# Patient Record
Sex: Female | Born: 1980
Health system: Southern US, Community
[De-identification: ages and names within clinical notes are randomized; demographics above are authoritative.]

## PROBLEM LIST (undated history)

## (undated) DIAGNOSIS — F419 Anxiety disorder, unspecified: Secondary | ICD-10-CM

## (undated) DIAGNOSIS — E785 Hyperlipidemia, unspecified: Secondary | ICD-10-CM

## (undated) DIAGNOSIS — T7840XA Allergy, unspecified, initial encounter: Secondary | ICD-10-CM

## (undated) DIAGNOSIS — IMO0001 Reserved for inherently not codable concepts without codable children: Secondary | ICD-10-CM

## (undated) HISTORY — DX: Hyperlipidemia, unspecified: E78.5

## (undated) HISTORY — DX: Anxiety disorder, unspecified: F41.9

## (undated) HISTORY — DX: Allergy, unspecified, initial encounter: T78.40XA

---

## 2011-11-24 ENCOUNTER — Encounter: Payer: Self-pay | Admitting: *Deleted

## 2011-11-24 ENCOUNTER — Emergency Department
Admission: EM | Admit: 2011-11-24 | Discharge: 2011-11-24 | Disposition: A | Discharge status: Home / Self Care | Payer: BC Managed Care – PPO | Source: Home / Self Care | Attending: Emergency Medicine | Admitting: Emergency Medicine

## 2011-11-24 DIAGNOSIS — B001 Herpesviral vesicular dermatitis: Secondary | ICD-10-CM

## 2011-11-24 DIAGNOSIS — B009 Herpesviral infection, unspecified: Secondary | ICD-10-CM

## 2011-11-24 DIAGNOSIS — J069 Acute upper respiratory infection, unspecified: Secondary | ICD-10-CM

## 2011-11-24 HISTORY — DX: Reserved for inherently not codable concepts without codable children: IMO0001

## 2011-11-24 MED ORDER — ACYCLOVIR 200 MG/5ML PO SUSP
400.0000 mg | Freq: Three times a day (TID) | ORAL | Status: DC
Start: 2011-11-24 — End: 2011-11-24

## 2011-11-24 MED ORDER — AZITHROMYCIN 250 MG PO TABS: ORAL_TABLET | ORAL | Status: AC

## 2011-11-24 MED ORDER — ACYCLOVIR 200 MG/5ML PO SUSP: ORAL | Status: DC

## 2011-11-24 NOTE — ED Notes (Signed)
Patient c/o productive cough, fever and congestion x 2 days.

## 2011-11-25 NOTE — ED Provider Notes (Signed)
History     CSN: [id number]  Arrival date & time 11/24/11  4098   First MD Initiated Contact with Patient 11/24/11 [phone]      Chief Complaint  Patient presents with  . Cough    HPI  Jody Gay is a 30 y.o. female who complains of onset of cold symptoms for 1 day.  Has not been using any over-the-counter treatment . She is [redacted] weeks pregnant, now in the second trimester, without any reported problems.  Her daughter has URI symptoms. She complains of severe flareup of cold sores lower lip which she has had chronically and recurrently. In the past, was prescribed acyclovir liquid for these which was very effective without side effects. She specifically request that I refilled the acyclovir liquid today.  No sore throat  + cough, with scant yellow sputum. No pleuritic pain No wheezing No nasal congestion + mild post-nasal drainage No sinus pain/pressure No chest congestion No itchy/red eyes No earache No hemoptysis No shortness of breath No chills/sweats + low grade fever No nausea No vomiting No abdominal pain No diarrhea No skin rashes + fatigue No myalgias No headache   Past Medical History  Diagnosis Date  . Gestational age less than 24 weeks     14weeks    Past Surgical History  Procedure Date  . Cesarean section     Family History  Problem Relation Age of Onset  . Cancer Mother     breast  . Hypertension Father   . Bipolar disorder Father   . Bipolar disorder Sister     History  Substance Use Topics  . Smoking status: Never Smoker   . Smokeless tobacco: Not on file  . Alcohol Use: No    OB History    Grav Para Term Preterm Abortions TAB SAB Ect Mult Living   1               Review of Systems  Allergies  Codeine; Gluten; and Penicillins  Home Medications   Current Outpatient Rx  Name Route Sig Dispense Refill  . PRENATAL 27-0.8 MG PO TABS Oral Take 1 tablet by mouth daily.      . ACYCLOVIR 200 MG/5ML PO SUSP  Take 10 mL's by mouth 3  times a day x5 days for acute cold sore flareup 150 mL 0  . AZITHROMYCIN 250 MG PO TABS  Take 2 tablets on day one, then 1 tablet daily on days 2 through 5 1 each 0    BP 116/75  Pulse 109  Temp(Src) 98.7 F (37.1 C) (Oral)  Resp 16  Ht 5\' 5"  (1.651 m)  Wt 137 lb 12.8 oz (62.506 kg)  BMI 22.93 kg/m2  SpO2 100%  Physical Exam  Nursing note and vitals reviewed. Constitutional: She is oriented to person, place, and time. She appears well-developed and well-nourished. No distress.  HENT:  Head: Normocephalic and atraumatic.  Right Ear: Tympanic membrane normal.  Left Ear: Tympanic membrane normal.  Nose: Nose normal.  Mouth/Throat: Oropharynx is clear and moist. No oropharyngeal exudate.       In the mid-lower lip, there is a prominent inflamed, swollen, exquisitely painful ulceration/cold sore. No drainage. No other oral or lip lesions.  Eyes: Right eye exhibits no discharge. Left eye exhibits no discharge. No scleral icterus.  Neck: Neck supple.  Cardiovascular: Normal rate, regular rhythm and normal heart sounds.   Pulmonary/Chest: No respiratory distress. She has no wheezes. She has rhonchi (few anterior rhonchi which clear after coughing.).  She has no rales.  Lymphadenopathy:    She has no cervical adenopathy.  Neurological: She is alert and oriented to person, place, and time.  Skin: Skin is warm and dry.    ED Course  Procedures (including critical care time)  Labs Reviewed - No data to display No results found.   1. URI (upper respiratory infection)   2. Recurrent cold sores       MDM  Treatment options discussed at length with patient. She likely has a viral URI/early bronchitis.  Given that she's in her second trimester, I explained that I would be very conservative with prescribing any medication. At her request, I prescribe Zithromax Z Pak, but specifically advised her not to get this filled unless her URI symptoms worsened , with higher fever and cough  productive of colored sputum. I researched on [employer] software that Zithromax is category B in pregnancy, with no adverse effects having been shown for patients in second trimester. I agreed to Rx the acyclovir your suspension, which she specifically requested as opposed to the acyclovir capsules. Again I counseled her on risks, benefits, alternatives.  I researched on [employer] software that acyclovir is category B in pregnancy, with no adverse effects having been shown for patients in second trimester. I specifically advised her to check with her OB/GYN before taking any of the above medications. Options discussed. Risks, benefits, alternatives discussed. Patient voiced understanding and agreement.         Lonell Face, MD 11/25/11 [phone]

## 2014-10-02 ENCOUNTER — Encounter: Payer: Self-pay | Admitting: *Deleted

## 2015-10-02 ENCOUNTER — Encounter: Payer: Self-pay | Admitting: *Deleted

## 2015-10-02 ENCOUNTER — Emergency Department (INDEPENDENT_AMBULATORY_CARE_PROVIDER_SITE_OTHER): Payer: BLUE CROSS/BLUE SHIELD

## 2015-10-02 ENCOUNTER — Emergency Department
Admission: EM | Admit: 2015-10-02 | Discharge: 2015-10-02 | Disposition: A | Discharge status: Home / Self Care | Payer: BLUE CROSS/BLUE SHIELD | Source: Home / Self Care | Attending: Family Medicine | Admitting: Family Medicine

## 2015-10-02 DIAGNOSIS — K5900 Constipation, unspecified: Secondary | ICD-10-CM

## 2015-10-02 DIAGNOSIS — R109 Unspecified abdominal pain: Secondary | ICD-10-CM

## 2015-10-02 MED ORDER — POLYETHYLENE GLYCOL 3350 17 G PO PACK: PACK | ORAL | Status: DC

## 2015-10-02 NOTE — ED Notes (Signed)
Pt c/o difficulty having a BM, some nausea, and abd cramping x 4 days. Reports taking Miralax and stool softener on Sunday and Monday with some relief. Denies fever. Reports some bright red blood with BM on Friday after straining.

## 2015-10-02 NOTE — Discharge Instructions (Signed)
You may use Miralax every 4 hours until you have a bowel movement.  You may then continue to use 1-2 times daily until you have regular bowel movements.  You may also try Apple, Prune, or Pear juice to help with bowel movements.   Be sure to stay well hydrated.  If you develop severe abdominal pain, unable to keep down fluids, unable to pass gas, or other new concerning symptoms develop, please call 911 or go to closest emergency department for further evaluation and treatment. See below for further instructions.

## 2015-10-02 NOTE — ED Provider Notes (Signed)
CSN: [id number]     Arrival date & time 10/02/15  1309 History   First MD Initiated Contact with Patient 10/02/15 1331     Chief Complaint  Patient presents with  . Constipation   (Consider location/radiation/quality/duration/timing/severity/associated sxs/prior Treatment) HPI Pt is a 34[facility] with c/o constipation with associated cramping worse in lower abdomen for 4 days.  Pt reports using Xylitol for 2 weeks but stopped on Thursday when she started to feel bloated.  Pt states she has felt constipated since.  She did try Miralax and a stool softener on Sunday and Monday with minimal relief.  Pt does report small amount of bright red blood after a BM on Friday but states she was straining at that time. No bleeding since then.  She is still able to pass gas. Denies fever, nausea or vomiting.  She has had a C-section several years ago but no other abdominal surgeries. Denies hx of SBO.    Past Medical History  Diagnosis Date  . Gestational age less than 24 weeks     14 weeks   Past Surgical History  Procedure Laterality Date  . Cesarean section     Family History  Problem Relation Age of Onset  . Cancer Mother     breast  . Hypertension Father   . Bipolar disorder Father   . Bipolar disorder Sister    Social History  Substance Use Topics  . Smoking status: Never Smoker   . Smokeless tobacco: None  . Alcohol Use: Yes   OB History    Gravida Para Term Preterm AB TAB SAB Ectopic Multiple Living   1              Review of Systems  Constitutional: Negative for fever and chills.  Gastrointestinal: Positive for abdominal pain, diarrhea and constipation. Negative for nausea and vomiting.  Genitourinary: Negative for dysuria and flank pain.  Musculoskeletal: Negative for myalgias and back pain.    Allergies  Codeine; Gluten; and Penicillins  Home Medications   Prior to Admission medications   Medication Sig Start Date End Date Taking? Authorizing Provider   polyethylene glycol (MIRALAX / GLYCOLAX) packet 17g (~1tblspn) Q4h until bowel movement. Daily or bid until regular BMs 10/02/15   Junius FinnerErin O'Malley, PA-C   Meds Ordered and Administered this Visit  Medications - No data to display  BP 150/88 mmHg  Pulse 76  Temp(Src) 98.3 F (36.8 C) (Oral)  Resp 16  Ht 5\' 5"  (1.651 m)  Wt 143 lb (64.864 kg)  BMI 23.80 kg/m2  SpO2 97%  LMP 09/11/2015  Breastfeeding? Unknown No data found.   Physical Exam  Constitutional: She appears well-developed and well-nourished. No distress.  HENT:  Head: Normocephalic and atraumatic.  Mouth/Throat: Oropharynx is clear and moist.  Eyes: Conjunctivae are normal. No scleral icterus.  Neck: Normal range of motion.  Cardiovascular: Normal rate, regular rhythm and normal heart sounds.   Pulmonary/Chest: Effort normal and breath sounds normal. No respiratory distress. She has no wheezes. She has no rales. She exhibits no tenderness.  Abdominal: Soft. Bowel sounds are normal. She exhibits no distension and no mass. There is no tenderness. There is no rebound and no guarding.  Soft, non-distended, non-tender. No rebound, guarding or masses.   Musculoskeletal: Normal range of motion.  Neurological: She is alert.  Skin: Skin is warm and dry. She is not diaphoretic.  Nursing note and vitals reviewed.   ED Course  Procedures (including critical care time)  Labs Review Labs Reviewed - No data to display  Imaging Review Dg Abd 2 Views  10/02/2015  CLINICAL DATA:  Abdominal pain, constipation EXAM: ABDOMEN - 2 VIEW COMPARISON:  None. FINDINGS: There is normal small bowel gas pattern. Moderate stool noted in right colon. Bony structures are unremarkable. Lumbar spine is unremarkable. Degenerative changes pubic symphysis. Bilateral hip joints are symmetrical in appearance. No free abdominal air. IMPRESSION: Normal small bowel gas pattern. Moderate stool in right colon. Degenerative changes pubic symphysis.  Electronically Signed   By: Natasha Mead M.D.   On: 10/02/2015 14:08     MDM   1. Abdominal cramping   2. Constipation    Pt c/o constipation, bloating and stomach cramping and taking Xylitol for 2 weeks.  Pt stopped using Xylitol on Thursday when symptoms started. Abdomen is soft, non-tender, no masses or rebound.  Low concern for SBO at this time or acute abdomen Abdominal plain films: normal small bowel gas pattern. Moderate stool in Right colon. Reassured pt. Encouraged pt to continue use of miralax and may try Apple, Prune, or Pear juice to help with BMs. Home care instructions for constipation provided. F/u with PCP in 3-4 days if not improving. Sooner if worsening.  Discussed symptoms that warrant emergent care in the ED. Patient verbalized understanding and agreement with treatment plan.    Junius Finner, PA-C 10/02/15 1430

## 2017-07-02 ENCOUNTER — Encounter: Payer: Self-pay | Admitting: Osteopathic Medicine

## 2017-07-02 ENCOUNTER — Ambulatory Visit (INDEPENDENT_AMBULATORY_CARE_PROVIDER_SITE_OTHER): Payer: 59 | Admitting: Osteopathic Medicine

## 2017-07-02 VITALS — BP 126/86 | HR 73 | Ht 65.0 in | Wt 153.0 lb

## 2017-07-02 DIAGNOSIS — F418 Other specified anxiety disorders: Secondary | ICD-10-CM | POA: Diagnosis not present

## 2017-07-02 DIAGNOSIS — F411 Generalized anxiety disorder: Secondary | ICD-10-CM

## 2017-07-02 DIAGNOSIS — Z8639 Personal history of other endocrine, nutritional and metabolic disease: Secondary | ICD-10-CM

## 2017-07-02 MED ORDER — CLONAZEPAM 0.5 MG PO TABS: 0.2500 mg | ORAL_TABLET | Freq: Two times a day (BID) | ORAL | 0 refills | Status: DC | PRN

## 2017-07-02 NOTE — Progress Notes (Signed)
HPI: Jody Gay is a 36 y.o. female  who [facility], 07/02/17,  for chief complaint of:  Chief Complaint  Patient presents with  . Establish Care   Hx anxiety but not feeling th eneed to be on medication anytime soon. Stressed out lately d/t house repairs, recent move.   Last pap 1 year ago, was normal  LMP last month, no problems.   Hx high cholesterol, takes OTC Fish Oil. Last PCP told her it was minor.     Past medical, surgical, social and family history reviewed: There are no active problems to display for this patient.  Past Surgical History:  Procedure Laterality Date  . CESAREAN SECTION     Social History  Substance Use Topics  . Smoking status: Never Smoker  . Smokeless tobacco: Not on file  . Alcohol use Yes   Family History  Problem Relation Age of Onset  . Cancer Mother        breast  . Hypertension Father   . Bipolar disorder Father   . Bipolar disorder Sister      Current medication list and allergy/intolerance information reviewed:   Current Outpatient Prescriptions  Medication Sig Dispense Refill  . clonazePAM (KLONOPIN) 0.5 MG tablet Take 0.5-1 tablets (0.25-0.5 mg total) by mouth 2 (two) times daily as needed for anxiety (use sparingly for severe symptoms). 20 tablet 0  . polyethylene glycol (MIRALAX / GLYCOLAX) packet 17g (~1tblspn) Q4h until bowel movement. Daily or bid until regular BMs 14 each 1   No current facility-administered medications for this visit.    Allergies  Allergen Reactions  . Codeine   . Gluten   . Penicillins       Review of Systems:  Constitutional:  No  fever, no chills, No recent illness, No unintentional weight changes. No significant fatigue.    HEENT: No  headache, no vision change  Cardiac: No  chest pain, No  pressure  Respiratory:  No  shortness of breath. No  Cough  Gastrointestinal: No  abdominal pain, No  nausea  Musculoskeletal: No new  myalgia/arthralgia  Skin: No  Rash  Neurologic: No  weakness, No  dizziness  Psychiatric: No  concerns with depression, +concerns with anxiety, No sleep problems, No mood problems  Exam:  BP 126/86   Pulse 73   Ht 5\' 5"  (1.651 m)   Wt 153 lb (69.4 kg)   LMP 06/25/2017   BMI 25.46 kg/m   Constitutional: VS see above. General Appearance: alert, well-developed, well-nourished, NAD  Eyes: Normal lids and conjunctive, non-icteric sclera  Ears, Nose, Mouth, Throat: MMM, Normal external inspection ears/nares/mouth/lips/gums.   Neck: No masses, trachea midline.   Respiratory: Normal respiratory effort. no wheeze, no rhonchi, no rales  Cardiovascular: S1/S2 normal, no murmur, no rub/gallop auscultated. RRR. No lower extremity edema.  Musculoskeletal: Gait normal  Neurological: Normal balance/coordination.   Psychiatric: Normal judgment/insight. Normal mood and affect. Oriented x3.   GAD 7 : Generalized Anxiety Score 07/02/2017  Nervous, Anxious, on Edge 3  Control/stop worrying 3  Worry too much - different things 3  Trouble relaxing 3  Restless 3  Easily annoyed or irritable 3  Afraid - awful might happen 3  Total GAD 7 Score 21    Depression screen PHQ 2/9 07/02/2017  Decreased Interest 1  Down, Depressed, Hopeless 1  PHQ - 2 Score 2  Altered sleeping 0  Tired, decreased energy 1  Change in appetite 1  Feeling  bad or failure about yourself  2  Trouble concentrating 0  Moving slowly or fidgety/restless 0  Suicidal thoughts 0  PHQ-9 Score 6     ASSESSMENT/PLAN:   Situational anxiety - Plan: clonazePAM (KLONOPIN) 0.5 MG tablet  History of high cholesterol  Generalized anxiety disorder - Family she mostly can control this when she is able to exercise more, recent weather has precluded that. Would consider SSRI therapy if persistent symptoms      Visit summary with medication list and pertinent instructions was printed for patient to review. All questions at  time of visit were answered - patient instructed to contact office with any additional concerns. ER/RTC precautions were reviewed with the patient. Follow-up plan: Return in about 6 weeks (around 08/13/2017) for recheck anxiety, and when due for annual.  Note: Total time spent 30 minutes, greater than 50% of the visit was spent face-to-face counseling and coordinating care for the following: The primary encounter diagnosis was Situational anxiety. Diagnoses of History of high cholesterol and Generalized anxiety disorder were also pertinent to this visit..Marland Kitchen

## 2017-07-03 DIAGNOSIS — F411 Generalized anxiety disorder: Secondary | ICD-10-CM | POA: Insufficient documentation

## 2017-07-03 DIAGNOSIS — F418 Other specified anxiety disorders: Secondary | ICD-10-CM | POA: Insufficient documentation

## 2017-07-03 DIAGNOSIS — Z8639 Personal history of other endocrine, nutritional and metabolic disease: Secondary | ICD-10-CM | POA: Insufficient documentation

## 2017-07-08 ENCOUNTER — Encounter: Payer: Self-pay | Admitting: Family Medicine

## 2017-07-08 ENCOUNTER — Ambulatory Visit (INDEPENDENT_AMBULATORY_CARE_PROVIDER_SITE_OTHER): Payer: 59 | Admitting: Family Medicine

## 2017-07-08 VITALS — BP 128/87 | HR 62 | Wt 150.0 lb

## 2017-07-08 DIAGNOSIS — L237 Allergic contact dermatitis due to plants, except food: Secondary | ICD-10-CM | POA: Diagnosis not present

## 2017-07-08 DIAGNOSIS — L255 Unspecified contact dermatitis due to plants, except food: Secondary | ICD-10-CM

## 2017-07-08 MED ORDER — PREDNISONE 10 MG PO TABS: ORAL_TABLET | ORAL | 0 refills | Status: DC

## 2017-07-08 NOTE — Progress Notes (Signed)
   Subjective:    Patient ID: Jody Gay, female    DOB: 01/08/1981, 36 y.o.   MRN: [id number]  HPI 36 year old female comes in today with a rash that started about 2 days ago. She and her husband were out in the yard and did get exposed to poison ivy. Several family members started to break out with a rash and her started yesterday. She is extremely itchy. She's been taking some oral Benadryl and using calamine lotion. Areas affected include lower legs, arms, neck, low back and abdomen.  Review of Systems     Objective:   Physical Exam  Constitutional: She is oriented to person, place, and time. She appears well-developed and well-nourished.  HENT:  Head: Normocephalic and atraumatic.  Pulmonary/Chest: Effort normal.  Neurological: She is alert and oriented to person, place, and time.  Skin: Skin is warm and dry. Rash noted.  Erythematous papular rash on both arms, both legs and thighs, lower back and neck. Some of the lesions are vesicular.  Psychiatric: She has a normal mood and affect. Her behavior is normal.          Assessment & Plan:  Rhus dermatitis - discussed dx. Will tx with oral prednisone.  Ok to continue benadryl and calamine lotion.

## 2017-07-08 NOTE — Patient Instructions (Addendum)
Poison Oak Dermatitis  Poison oak dermatitis is inflammation of the skin that is caused by contact with the allergens on the leaves of the poison oak (toxicodendron) plant. The skin reaction often includes redness, swelling, blisters, and extreme itching.  What are the causes?  This condition is caused by a specific chemical (urushiol) that is found in the sap of the poison oak plant. This chemical is sticky and it can be easily spread to people, animals, and objects. You can get poison oak dermatitis by:  · Having direct contact with a poison oak plant.  · Touching animals, other people, or objects that have come in contact with poison oak and have the chemical on them.    What increases the risk?  This condition is more likely to develop in people who:  · Are outdoors often.  · Go outdoors without wearing protective clothing, such as closed shoes, long pants, and a long-sleeved shirt.    What are the signs or symptoms?  Symptoms of this condition include:  · Redness of the skin.  · A rash that may develop blisters.  · Extreme itching.  · Swelling. This may occur if the reaction is more severe.    Symptoms usually last for 1-2 weeks. However, the first time you develop this condition, symptoms may last 3-4 weeks.  How is this diagnosed?  This condition may be diagnosed based on your symptoms and a physical exam. Your health care provider may also ask you about any recent outdoor activity.  How is this treated?  Treatment for this condition will vary depending on how severe it is. Treatment may include:  · Hydrocortisone creams or calamine lotions to relieve itching.  · Oatmeal baths to soothe the skin.  · Over-the-counter antihistamine tablets.  · Oral steroid medicine for more severe outbreaks.    Follow these instructions at home:  · Take or apply over-the-counter and prescription medicines only as told by your health care provider.  · Wash exposed skin as soon as possible with soap and cold water.  · Use  hydrocortisone creams or calamine lotion as needed to soothe the skin and relieve itching.  · Take oatmeal baths as needed. Use colloidal oatmeal. You can get this at your local pharmacy or grocery store. Follow the instructions on the packaging.  · Do not scratch or rub your skin.  · While you have the rash, wash clothes right after you wear them.  How is this prevented?  · Learn to identify the poison oak plant and avoid contact with the plant. This plant can be recognized by the number of leaves. Generally, poison oak has three leaves with flowering branches on a single stem. The leaves are often a bit fuzzy and have a toothlike edge.  · If you have been exposed to poison oak, thoroughly wash with soap and water right away. You have about 30 minutes to remove the plant resin before it will cause the rash. Be sure to wash under your fingernails because any plant resin there will continue to spread the rash.  · When hiking or camping, wear clothes that will help you avoid exposure on the skin. This includes long pants, a long-sleeved shirt, tall socks, and hiking boots. You can also apply preventive lotion to your skin to help limit exposure.  · If you suspect that your clothes or outdoor gear came in contact with poison oak, rinse them off outside with a garden hose before bringing them inside your house.    Contact a health care provider if:  · You have open sores in the rash area.  · You have more redness, swelling, or pain in the affected area.  · You have redness that spreads beyond the rash area.  · You have fluid, blood, or pus coming from the affected area.  · You have a fever.  · You have a rash over a large area of your body.  · You have a rash on your eyes, mouth, or genitals.  · Your rash does not improve after a few days.  Get help right away if:  · Your face swells or your eyes swell shut.  · You have trouble breathing.  · You have trouble swallowing.  This information is not intended to replace advice  given to you by your health care provider. Make sure you discuss any questions you have with your health care provider.  Document Released: 05/24/2003 Document Revised: 04/24/2016 Document Reviewed: 04/25/2015  Elsevier Interactive Patient Education © 2018 [employer]

## 2017-08-10 ENCOUNTER — Other Ambulatory Visit: Payer: Self-pay | Admitting: Family Medicine

## 2017-08-13 ENCOUNTER — Ambulatory Visit (INDEPENDENT_AMBULATORY_CARE_PROVIDER_SITE_OTHER): Payer: 59 | Admitting: Osteopathic Medicine

## 2017-08-13 ENCOUNTER — Encounter: Payer: Self-pay | Admitting: Osteopathic Medicine

## 2017-08-13 VITALS — BP 115/76 | HR 67 | Wt 151.0 lb

## 2017-08-13 DIAGNOSIS — G43109 Migraine with aura, not intractable, without status migrainosus: Secondary | ICD-10-CM | POA: Diagnosis not present

## 2017-08-13 DIAGNOSIS — F418 Other specified anxiety disorders: Secondary | ICD-10-CM

## 2017-08-13 MED ORDER — SUMATRIPTAN SUCCINATE 50 MG PO TABS: 50.0000 mg | ORAL_TABLET | Freq: Once | ORAL | 1 refills | Status: DC

## 2017-08-13 NOTE — Patient Instructions (Signed)
Plan:  When need refills of the anxiety medicine, call the office   Try the Imitrex as needed for migraine - max use once per week but sounds like you won't need it that often

## 2017-08-13 NOTE — Progress Notes (Signed)
HPI: Jody GaultKelly Faciane is a 36 y.o. female  who [facility] today, 08/13/17,  for chief complaint of:  Chief Complaint  Patient presents with  . Follow-up    anxiety, migraines    Anxiety - situational, doing well on prn Clonazepam, has used maybe 3 times since last viist   Hx ocular migraines: recent headache she wasn't able to take Tylenol and migraine got a bit worse than usual, some trouble word finding as well as blurred vision though the vision symptoms are usual for her. No weakness. Typically has migraine every few months, never on Rx medication for it     Past medical history, surgical history, social history and family history reviewed.  Patient Active Problem List   Diagnosis Date Noted  . Situational anxiety 07/03/2017  . History of high cholesterol 07/03/2017  . Generalized anxiety disorder 07/03/2017    Current medication list and allergy/intolerance information reviewed.   Current Outpatient Prescriptions on File Prior to Visit  Medication Sig Dispense Refill  . clonazePAM (KLONOPIN) 0.5 MG tablet Take 0.5-1 tablets (0.25-0.5 mg total) by mouth 2 (two) times daily as needed for anxiety (use sparingly for severe symptoms). 20 tablet 0   No current facility-administered medications on file prior to visit.    Allergies  Allergen Reactions  . Codeine   . Gluten   . Penicillins       Review of Systems:  Constitutional: No recent illness  HEENT: No  headache, no vision change  Cardiac: No  chest pain, No  pressure  Respiratory:  No  shortness of breath.  Neurologic: No  weakness, No  Dizziness  Psychiatric: No  concerns with depression, No  concerns with anxiety  Exam:  BP 115/76   Pulse 67   Wt 151 lb (68.5 kg)   BMI 25.13 kg/m   Constitutional: VS see above. General Appearance: alert, well-developed, well-nourished, NAD  Eyes: Normal lids and conjunctive, non-icteric sclera  Ears, Nose, Mouth, Throat:  MMM, Normal external inspection ears/nares/mouth/lips/gums.  Neck: No masses, trachea midline.   Respiratory: Normal respiratory effort.  Musculoskeletal: Gait normal. Symmetric and independent movement of all extremities  Neurological: Normal balance/coordination. No tremor.  Skin: warm, dry, intact.   Psychiatric: Normal judgment/insight. Normal mood and affect. Oriented x3.     GAD 7 : Generalized Anxiety Score 07/02/2017  Nervous, Anxious, on Edge 3  Control/stop worrying 3  Worry too much - different things 3  Trouble relaxing 3  Restless 3  Easily annoyed or irritable 3  Afraid - awful might happen 3  Total GAD 7 Score 21    [facility] 08/13/2017 07/02/2017  Decreased Interest - 1  Down, Depressed, Hopeless - 1  PHQ - 2 Score - 2  Altered sleeping 0 0  Tired, decreased energy 1 1  Change in appetite 0 1  Feeling bad or failure about yourself  0 2  Trouble concentrating 0 0  Moving slowly or fidgety/restless 0 0  Suicidal thoughts 0 0  PHQ-9 Score - 6      ASSESSMENT/PLAN:   Situational anxiety  Ocular migraine    Patient Instructions  Plan:  When need refills of the anxiety medicine, call the office   Try the Imitrex as needed for migraine - max use once per week but sounds like you won't need it that often     Follow-up plan: Return for ANNUAL PHYSICAL  .  Visit summary with medication list and  pertinent instructions was printed for patient to review, alert [place] if any changes needed. All questions at time of visit were answered - patient instructed to contact office with any additional concerns. ER/RTC precautions were reviewed with the patient and understanding verbalized.

## 2019-11-16 ENCOUNTER — Ambulatory Visit: Payer: 59 | Admitting: Sports Medicine

## 2019-11-16 ENCOUNTER — Ambulatory Visit (INDEPENDENT_AMBULATORY_CARE_PROVIDER_SITE_OTHER): Payer: 59

## 2019-11-16 ENCOUNTER — Encounter: Payer: Self-pay | Admitting: Sports Medicine

## 2019-11-16 ENCOUNTER — Other Ambulatory Visit: Payer: Self-pay

## 2019-11-16 DIAGNOSIS — R109 Unspecified abdominal pain: Secondary | ICD-10-CM

## 2019-11-16 DIAGNOSIS — R10A2 Flank pain, left side: Secondary | ICD-10-CM | POA: Insufficient documentation

## 2019-11-16 DIAGNOSIS — M25512 Pain in left shoulder: Secondary | ICD-10-CM

## 2019-11-16 DIAGNOSIS — R011 Cardiac murmur, unspecified: Secondary | ICD-10-CM

## 2019-11-16 MED ORDER — MELOXICAM 15 MG PO TABS: ORAL_TABLET | ORAL | 3 refills | Status: DC

## 2019-11-16 NOTE — Assessment & Plan Note (Signed)
Unclear etiology, adding echocardiogram, labs, ECG.

## 2019-11-16 NOTE — Assessment & Plan Note (Signed)
Suspect rotator cuff impingement syndrome, formal PT, meloxicam, x-rays, return in 6 weeks for this.

## 2019-11-16 NOTE — Progress Notes (Addendum)
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  In addition she has had chronic pain in her left flank at the tip of her ninth through 12th ribs.  No fevers, chills, dysuria.  Tachycardia: Asymptomatic.  I reviewed the past medical history, family history, social history, surgical history, and allergies today and no changes were needed.  Please see the problem list section below in epic for further details.  Past Medical History: Past Medical History:  Diagnosis Date  . Anxiety   . Gestational age less than 24 weeks    14weeks  . Hyperlipidemia    Past Surgical History: Past Surgical History:  Procedure Laterality Date  . CESAREAN SECTION     Social History: Social History   Socioeconomic History  . Marital status: Married    Spouse name: Not on file  . Number of children: Not on file  . Years of education: Not on file  . Highest education level: Not on file  Occupational History  . Not on file  Tobacco Use  . Smoking status: Never Smoker  . Smokeless tobacco: Never Used  Substance and Sexual Activity  . Alcohol use: Yes  . Drug use: No  . Sexual activity: Yes  Other Topics Concern  . Not on file  Social History Narrative  . Not on file   Social Determinants of Health   Financial Resource Strain:   . Difficulty of Paying Living Expenses: Not on file  Food Insecurity:   . Worried About Charity fundraiser in the Last Year: Not on file  . Ran Out of Food in the Last Year: Not on file  Transportation Needs:   . Lack of Transportation (Medical): Not on file  . Lack of Transportation (Non-Medical): Not on file  Physical Activity:   . Days of Exercise per Week: Not on file  . Minutes of Exercise per Session:  Not on file  Stress:   . Feeling of Stress : Not on file  Social Connections:   . Frequency of Communication with Friends and Family: Not on file  . Frequency of Social Gatherings with Friends and Family: Not on file  . Attends Religious Services: Not on file  . Active Member of Clubs or Organizations: Not on file  . Attends Archivist Meetings: Not on file  . Marital Status: Not on file   Family History: Family History  Problem Relation Age of Onset  . Cancer Mother        breast  . Hypertension Father   . Bipolar disorder Father   . Bipolar disorder Sister    Allergies: Allergies  Allergen Reactions  . Codeine   . Gluten   . Penicillins    Medications: See med rec.  Review of Systems: No fevers, chills, night sweats, weight loss, chest pain, or shortness of breath.   Objective:    General: Well Developed, well nourished, and in no acute distress.  Neuro: Alert and oriented x3, extra-ocular muscles intact, sensation grossly intact.  HEENT: Normocephalic, atraumatic, pupils equal round reactive to light, neck supple, no masses, no lymphadenopathy, thyroid nonpalpable.  Skin: Warm and dry, no rashes. Cardiac: Regular rate and rhythm, no murmurs rubs or gallops, no lower extremity edema.  Respiratory: Clear to auscultation bilaterally. Not using accessory muscles, speaking in full sentences. Left Shoulder: Inspection reveals no abnormalities, atrophy or asymmetry. Palpation is normal with no tenderness over AC joint or bicipital groove. ROM is full in all planes. Rotator cuff strength normal throughout. Positive Neer and Hawkin's tests, empty can. Speeds and Yergason's tests normal. No labral pathology noted with negative Obrien's, negative crank, negative clunk, and good stability. Normal scapular function observed. No painful arc and no drop arm sign. No apprehension sign Abdomen: Soft, nontender, nondistended, no palpable masses, no guarding, rigidity,  rebound tenderness.  Twelve-lead ECG personally reviewed, sinus tachycardia with a rate of 101, left axis deviation, otherwise normal PR interval, QRS, T waves.  No ST changes.  Impression and Recommendations:    Left shoulder pain Suspect rotator cuff impingement syndrome, formal PT, meloxicam, x-rays, return in 6 weeks for this.  Systolic murmur Unclear etiology, adding echocardiogram, labs, ECG.   Left flank pain Unclear etiology but localized over the floating ribs, question quadratus lumborum strain. Adding left-sided rib series x-rays.  Hypercalcemia Adding PTH   ___________________________________________ [name]. Benjamin Stain, M.D., ABFM., CAQSM. Primary Care and Sports Medicine [facility]  Adjunct Professor of Family Medicine  [employer]

## 2019-11-16 NOTE — Assessment & Plan Note (Signed)
Unclear etiology but localized over the floating ribs, question quadratus lumborum strain. Adding left-sided rib series x-rays.

## 2019-11-17 LAB — URINALYSIS W MICROSCOPIC + REFLEX CULTURE
Bacteria, UA: NONE SEEN /HPF
Bilirubin Urine: NEGATIVE
Glucose, UA: NEGATIVE
Hgb urine dipstick: NEGATIVE
Hyaline Cast: NONE SEEN /LPF
Ketones, ur: NEGATIVE
Leukocyte Esterase: NEGATIVE
Nitrites, Initial: NEGATIVE
Protein, ur: NEGATIVE
RBC / HPF: NONE SEEN /HPF (ref 0–2)
Specific Gravity, Urine: 1.008 (ref 1.001–1.03)
Squamous Epithelial / HPF: NONE SEEN /HPF (ref <=5)
WBC, UA: NONE SEEN /HPF (ref 0–5)
pH: 7 (ref 5.0–8.0)

## 2019-11-17 LAB — COMPLETE METABOLIC PANEL WITH GFR
AG Ratio: 1.4 (calc) (ref 1.0–2.5)
ALT: 29 U/L (ref 6–29)
AST: 23 U/L (ref 10–30)
Albumin: 4.5 g/dL (ref 3.6–5.1)
Alkaline phosphatase (APISO): 51 U/L (ref 31–125)
BUN: 8 mg/dL (ref 7–25)
CO2: 25 mmol/L (ref 20–32)
Calcium: 10.8 mg/dL — ABNORMAL HIGH (ref 8.6–10.2)
Chloride: 103 mmol/L (ref 98–110)
Creat: 0.75 mg/dL (ref 0.50–1.10)
GFR, Est African American: 118 mL/min/{1.73_m2} (ref >=60)
GFR, Est Non African American: 102 mL/min/{1.73_m2} (ref >=60)
Globulin: 3.3 g/dL (calc) (ref 1.9–3.7)
Glucose, Bld: 116 mg/dL — ABNORMAL HIGH (ref 65–99)
Potassium: 4 mmol/L (ref 3.5–5.3)
Sodium: 139 mmol/L (ref 135–146)
Total Bilirubin: 0.3 mg/dL (ref 0.2–1.2)
Total Protein: 7.8 g/dL (ref 6.1–8.1)

## 2019-11-17 LAB — CBC WITH DIFFERENTIAL/PLATELET
Absolute Monocytes: 422 cells/uL (ref 200–950)
Basophils Absolute: 19 cells/uL (ref 0–200)
Basophils Relative: 0.3 %
Eosinophils Absolute: 90 cells/uL (ref 15–500)
Eosinophils Relative: 1.4 %
HCT: 40.8 % (ref 35.0–45.0)
Hemoglobin: 13.7 g/dL (ref 11.7–15.5)
Lymphs Abs: 1338 cells/uL (ref 850–3900)
MCH: 30.2 pg (ref 27.0–33.0)
MCHC: 33.6 g/dL (ref 32.0–36.0)
MCV: 89.9 fL (ref 80.0–100.0)
MPV: 9.3 fL (ref 7.5–12.5)
Monocytes Relative: 6.6 %
Neutro Abs: 4531 cells/uL (ref 1500–7800)
Neutrophils Relative %: 70.8 %
Platelets: 341 10*3/uL (ref 140–400)
RBC: 4.54 10*6/uL (ref 3.80–5.10)
RDW: 12.5 % (ref 11.0–15.0)
Total Lymphocyte: 20.9 %
WBC: 6.4 10*3/uL (ref 3.8–10.8)

## 2019-11-17 LAB — D-DIMER, QUANTITATIVE: D-Dimer, Quant: 0.21 mcg/mL FEU (ref <0.50)

## 2019-11-17 LAB — TSH: TSH: 2.03 mIU/L

## 2019-11-17 LAB — PREGNANCY, URINE: Preg Test, Ur: NEGATIVE

## 2019-11-17 LAB — NO CULTURE INDICATED

## 2019-11-17 NOTE — Addendum Note (Signed)
Addended by: Silverio Decamp on: 11/17/2019 08:05 AM   Modules accepted: Orders

## 2019-11-17 NOTE — Assessment & Plan Note (Signed)
Adding PTH

## 2019-11-21 ENCOUNTER — Telehealth: Payer: Self-pay

## 2019-11-21 LAB — PTH, INTACT AND CALCIUM
Calcium: 9.5 mg/dL (ref 8.6–10.2)
PTH: 24 pg/mL (ref 14–64)

## 2019-11-21 NOTE — Telephone Encounter (Signed)
Patient advised. Will do echo

## 2019-11-21 NOTE — Telephone Encounter (Signed)
Patient called stating she had echo ordered and when she was contacted today about her out of pocket price she was hesitant to have it done.   Patient wants to know if this is extra-precautionary test or if this is really truly necessary. States her EKG and blood work was fine, so she isn't sure this is actually necessary.   Please advise

## 2019-11-21 NOTE — Telephone Encounter (Signed)
The EKG tells [place] the heart is electrically normal but it does not tell us if it is structurally normal.  I do think that the echo is necessary.

## 2019-11-23 ENCOUNTER — Other Ambulatory Visit: Payer: Self-pay

## 2019-11-23 ENCOUNTER — Ambulatory Visit (HOSPITAL_BASED_OUTPATIENT_CLINIC_OR_DEPARTMENT_OTHER)
Admission: RE | Admit: 2019-11-23 | Discharge: 2019-11-23 | Disposition: A | Discharge status: Home / Self Care | Payer: 59 | Source: Ambulatory Visit | Attending: Sports Medicine | Admitting: Sports Medicine

## 2019-11-23 DIAGNOSIS — R011 Cardiac murmur, unspecified: Secondary | ICD-10-CM

## 2019-11-23 NOTE — Progress Notes (Signed)
  Echocardiogram 2D Echocardiogram has been performed.  [name] 11/23/2019, 3:06 PM

## 2019-11-28 ENCOUNTER — Other Ambulatory Visit: Payer: Self-pay

## 2019-11-28 ENCOUNTER — Ambulatory Visit (INDEPENDENT_AMBULATORY_CARE_PROVIDER_SITE_OTHER): Payer: 59 | Admitting: Rehabilitative and Restorative Service Providers"

## 2019-11-28 DIAGNOSIS — M25512 Pain in left shoulder: Secondary | ICD-10-CM

## 2019-11-28 NOTE — Patient Instructions (Signed)
Access Code: [email or web address]  URL: [email or web address]  Date: 11/28/2019  Prepared by: Rudell Cobb   Exercises Kneeling Plank with Feet on Ground - 10 reps - 1 sets - 10 seconds hold - 2x daily - 7x weekly Standing Single Arm Shoulder Abduction with Resistance - 10-15 reps - 1 sets - 2x daily - 7x weekly Shoulder Internal Rotation with Resistance - 10-15 reps - 1 sets - 2x daily - 7x weekly Doorway Pec Stretch at 60 Degrees Abduction with Arm Straight - 2 reps - 1 sets - 20 seconds hold - 2x daily - 7x weekly Doorway Pec Stretch at 90 Degrees Abduction - 2 reps - 1 sets - 20 seconds hold - 2x daily - 7x weekly Doorway Pec Stretch at 120 Degrees Abduction - 2 reps - 1 sets - 20 seconds hold - 2x daily - 7x weekly

## 2019-11-28 NOTE — Therapy (Addendum)
[facility]  [address] [place], [place], [address] Phone: [phone]   Fax:  [phone]  Physical Therapy Evaluation/ Discharge Summary  Patient Details  Name: Jody Gay MRN: [id number] Date of Birth: Apr 28, 1981 Referring Provider (PT): Monica Bectonhekkekandam, Thomas J, MD   Encounter Date: 11/28/2019  PT End of Session - 11/28/19 2133    Visit Number  1    Number of Visits  4    Date for PT Re-Evaluation  01/12/20    PT Start Time  1320    PT Stop Time  1400    PT Time Calculation (min)  40 min       Past Medical History:  Diagnosis Date  . Anxiety   . Gestational age less than 24 weeks    14weeks  . Hyperlipidemia     Past Surgical History:  Procedure Laterality Date  . CESAREAN SECTION      There were no vitals filed for this visit.   Subjective Assessment - 11/28/19 1522    Subjective  The patient reports onset of shoulder pain in June at distal deltoid region, L flank pain after repairing a basement wall and lifting heavy concrete blocks. The patient was prescribed meloxicam and this has significantly reduced her pain.  She reports she is not having resting pain unless meds are wearing off (in proximal arm and elbow).    Patient Stated Goals  Return to exercise without modification, reduce pain.    Currently in Pain?  Yes    Pain Score  0-No pain   discomfort with activities during evaluation   Pain Location  Shoulder    Pain Orientation  Left    Pain Descriptors / Indicators  Sore    Pain Type  Chronic pain    Pain Onset  More than a month ago    Pain Frequency  Intermittent    Aggravating Factors   varies    Pain Relieving Factors  improved with  meloxicam         [facility] PT Assessment - 11/28/19 1530      Assessment   Medical Diagnosis  M25.512 (ICD-10-CM) - Acute pain of left shoulder/ flank pain (9th-12th ribs)    Referring Provider (PT)  Monica Bectonhekkekandam, Thomas J, MD    Onset Date/Surgical  Date  --   June 2020   Hand Dominance  Right    Prior Therapy  none      Precautions   Precautions  None      Restrictions   Weight Bearing Restrictions  No      Balance Screen   Has the patient fallen in the past 6 months  No    Has the patient had a decrease in activity level because of a fear of falling?   No    Is the patient reluctant to leave their home because of a fear of falling?   No      Home Environment   Living Environment  Private residence    Living Arrangements  Children;Spouse/significant other      Prior Function   Level of Independence  Independent      Observation/Other Assessments   Focus on Therapeutic Outcomes (FOTO)   92%      Sensation   Light Touch  Appears Intact    Additional Comments  some nighttime numbness, imrpoves with position changes      ROM / Strength   AROM / PROM / Strength  AROM;Strength  AROM   Overall AROM   Within functional limits for tasks performed      Strength   Overall Strength  Within functional limits for tasks performed    Overall Strength Comments  5/5 for shoulder flexion, abduction, 5/5 for elbow flexion/extension.      Special Tests    Special Tests  Rotator Cuff Impingement    Other special tests  --    Rotator Cuff Impingment tests  Speed's test;Empty Can test;Hawkins- Kennedy test      Hawkins-Kennedy test   Findings  Positive    Side  Left      Empty Can test   Findings  Positive    Side  Left      Speed's test   Findings  Negative    Side  Left                Objective measurements completed on examination: See above findings.      [place] Adult PT Treatment/Exercise - 11/28/19 2138      Exercises   Exercises  Shoulder      Shoulder Exercises: Prone   Other Prone Exercises  8 point plank with scapular stabilization emphasized       Shoulder Exercises: Standing   Internal Rotation  Strengthening;Left;10 reps    Theraband Level (Shoulder Internal Rotation)  Level 2 (Red)     ABduction  Strengthening;Left;10 reps    Theraband Level (Shoulder ABduction)  Level 2 (Red)    ABduction Limitations  0-90 degrees      Shoulder Exercises: Stretch   Other Shoulder Stretches  Door frame 60, 90, 120 stretch x 20 second holds             PT Education - 11/28/19 2133    Education Details  HEP initiated for shoulder strengthening and stretching    Person(s) Educated  Patient    Methods  Explanation;Demonstration;Handout    Comprehension  Returned demonstration;Verbalized understanding          PT Long Term Goals - 11/28/19 2140      PT LONG TERM GOAL #1   Title  The patient will be independent with HEP for L shoulder strengthening and stretching.    Time  6    Period  Weeks    Target Date  01/12/20      PT LONG TERM GOAL #2   Title  The patient will report return to home exercise routine without L shoulder pain.    Time  6    Period  Weeks    Target Date  01/12/20             Plan - 11/28/19 1603    Clinical Impression Statement  The patient is a 38 year old female presenting to OP physical therapy with L shoulder pain x months. During today's evaluation, she notes significant improvement in pain level since beginning meloxicam.  She tolerated exercises well and prefers to work on exercises established today for a few weeks and return to clinic if needed.    Stability/Clinical Decision Making  Stable/Uncomplicated    Clinical Decision Making  Low    Rehab Potential  Good    PT Frequency  1x / week    PT Duration  4 weeks    PT Treatment/Interventions  Therapeutic exercise;Patient/family education;Taping;Therapeutic activities;Manual techniques;ADLs/Self Care Home Management;Iontophoresis 4mg /ml Dexamethasone;Electrical Stimulation;Ultrasound    PT Next Visit Plan  Patient to call for f/u visit as needed.    Consulted and  Agree with Plan of Care  Patient       Patient will benefit from skilled therapeutic intervention in order to improve the  following deficits and impairments:  Pain  Visit Diagnosis: No diagnosis found.     Problem List Patient Active Problem List   Diagnosis Date Noted  . Hypercalcemia 11/17/2019  . Left shoulder pain 11/16/2019  . Systolic murmur 11/16/2019  . Left flank pain 11/16/2019  . Ocular migraine 08/13/2017  . Situational anxiety 07/03/2017  . History of high cholesterol 07/03/2017  . Generalized anxiety disorder 07/03/2017    DISCHARGE:  The patient did not return for further therapy.  *See above note for patient status.  Jody Gay,Jody Gay 11/28/2019, 9:52 PM  [facility] [address] [place] [address] [place], [place], [address] Phone: [phone]   Fax:  [phone]  Name: Jody Gay MRN: [id number] Date of Birth: 05-02-1981

## 2019-12-28 ENCOUNTER — Ambulatory Visit: Payer: 59 | Admitting: Sports Medicine

## 2019-12-28 ENCOUNTER — Other Ambulatory Visit: Payer: Self-pay

## 2019-12-28 DIAGNOSIS — R10A2 Flank pain, left side: Secondary | ICD-10-CM

## 2019-12-28 DIAGNOSIS — R200 Anesthesia of skin: Secondary | ICD-10-CM | POA: Diagnosis not present

## 2019-12-28 DIAGNOSIS — R109 Unspecified abdominal pain: Secondary | ICD-10-CM | POA: Diagnosis not present

## 2019-12-28 DIAGNOSIS — M25512 Pain in left shoulder: Secondary | ICD-10-CM

## 2019-12-28 NOTE — Progress Notes (Signed)
    Procedures performed today:    None.  Independent interpretation of tests performed by another provider:   Shoulder and lumbar spine x-rays personally reviewed and unremarkable.  Impression and Recommendations:    Numbness of toes Kadesha returns, she has been doing more running lately and she is noting intermittent numbness in the tips of her fourth and fifth toes. Leg lengths are equal, hip abductor strength is good, shoe size is good, frequently occasional toe numbness is normal in runners. No further intervention needed. Should this recur we will likely do more of an aggressive work-up with her lumbar spine.  Left flank pain Rheya was having some left-sided flank pain, testing was negative in the form of labs and imaging. This is for the most part resolved with meloxicam.  Left shoulder pain Left shoulder pain was mostly rotator cuff impingement in quality, she is done some physical therapy, meloxicam, symptoms are reviewed for the most part resolved. A lot of this is because they are doing their own work on their house.    ___________________________________________ [name]. Benjamin Stain, M.D., ABFM., CAQSM. Primary Care and Sports Medicine [facility]  Adjunct Instructor of Family Medicine  [employer]

## 2019-12-28 NOTE — Assessment & Plan Note (Addendum)
Saraya returns, she has been doing more running lately and she is noting intermittent numbness in the tips of her fourth and fifth toes. Leg lengths are equal, hip abductor strength is good, shoe size is good, frequently occasional toe numbness is normal in runners. No further intervention needed. Should this recur we will likely do more of an aggressive work-up with her lumbar spine.

## 2019-12-28 NOTE — Assessment & Plan Note (Signed)
Left shoulder pain was mostly rotator cuff impingement in quality, she is done some physical therapy, meloxicam, symptoms are reviewed for the most part resolved. A lot of this is because they are doing their own work on their house.

## 2019-12-28 NOTE — Assessment & Plan Note (Signed)
Meloni was having some left-sided flank pain, testing was negative in the form of labs and imaging. This is for the most part resolved with meloxicam.

## 2020-02-13 ENCOUNTER — Encounter: Payer: Self-pay | Admitting: Medical-Surgical

## 2020-04-29 ENCOUNTER — Emergency Department
Admission: EM | Admit: 2020-04-29 | Discharge: 2020-04-29 | Disposition: A | Discharge status: Home / Self Care | Payer: 59 | Source: Home / Self Care

## 2020-04-29 ENCOUNTER — Emergency Department (INDEPENDENT_AMBULATORY_CARE_PROVIDER_SITE_OTHER): Payer: 59

## 2020-04-29 ENCOUNTER — Other Ambulatory Visit: Payer: Self-pay

## 2020-04-29 ENCOUNTER — Encounter: Payer: Self-pay | Admitting: Emergency Medicine

## 2020-04-29 DIAGNOSIS — R0602 Shortness of breath: Secondary | ICD-10-CM | POA: Diagnosis not present

## 2020-04-29 DIAGNOSIS — F419 Anxiety disorder, unspecified: Secondary | ICD-10-CM | POA: Diagnosis not present

## 2020-04-29 DIAGNOSIS — Z733 Stress, not elsewhere classified: Secondary | ICD-10-CM

## 2020-04-29 DIAGNOSIS — R0789 Other chest pain: Secondary | ICD-10-CM

## 2020-04-29 DIAGNOSIS — F439 Reaction to severe stress, unspecified: Secondary | ICD-10-CM

## 2020-04-29 NOTE — ED Triage Notes (Signed)
Patient c/o not being able to get a good deep breath since Monday.  No cold sx's, other than a cold sore, no cough, thinks a possible virus.

## 2020-04-29 NOTE — ED Provider Notes (Signed)
[facility]    CSN: [id number] Arrival date & time: 04/29/20  1213      History   Chief Complaint Chief Complaint  Patient presents with  . Shortness of Breath    HPI Jody Gay is a 39 y.o. female.   HPI Jody Gay is a 39 y.o. female presenting to UC with c/o sensation of not being able to take a full deep breath for about 1 week. She reports being under a lot of stress and also states she took her daughter to the emergency department earlier this week for nausea and vomiting, concerned she had appendicitis but symptoms resolved in about 6 hours.  Pt reports developing a cold sore the next day. She is concerned she may have a virus causing her SOB but is not sure if it is also due to her anxiety. Denies fever, chills, cough, congestion. No chest pain. She had Covid in November 2020, those symptoms resolved. She has congestion and loss of taste and smell then. She has no congestion and can still taste and smell today.  She has not received the covid vaccine.  She does report taking her Klonopin earlier this week to try to help her symptoms but gets anxious about taking that medication in fear she will become dependant due to family hx of substance abuse.  She has not f/u with her PCP for current symptoms.   Denies hx of CAD, blood clots, leg pain or swelling, surgery or long travel.  She is not on birth control and does not smoke.   Past Medical History:  Diagnosis Date  . Anxiety   . Gestational age less than 24 weeks    14weeks  . Hyperlipidemia     Patient Active Problem List   Diagnosis Date Noted  . Numbness of toes 12/28/2019  . Hypercalcemia 11/17/2019  . Left shoulder pain 11/16/2019  . Systolic murmur 11/16/2019  . Left flank pain 11/16/2019  . Ocular migraine 08/13/2017  . Situational anxiety 07/03/2017  . History of high cholesterol 07/03/2017  . Generalized anxiety disorder 07/03/2017    Past Surgical History:  Procedure Laterality Date  .  CESAREAN SECTION      OB History    Gravida  1   Para      Term      Preterm      AB      Living        SAB      TAB      Ectopic      Multiple      Live Births               Home Medications    Prior to Admission medications   Medication Sig Start Date End Date Taking? Authorizing Provider  clonazePAM (KLONOPIN) 0.5 MG tablet Take 0.5-1 tablets (0.25-0.5 mg total) by mouth 2 (two) times daily as needed for anxiety (use sparingly for severe symptoms). 07/02/17  Yes Sunnie Nielsen, DO  meloxicam (MOBIC) 15 MG tablet One tab PO qAM with a meal for 2 weeks, then daily prn pain. 11/16/19   Monica Becton, MD  SUMAtriptan (IMITREX) 50 MG tablet Take 1 tablet (50 mg total) by mouth once. May repeat in 2 hours if headache persists or recurs. 08/13/17 08/13/17  Sunnie Nielsen, DO    Family History Family History  Problem Relation Age of Onset  . Cancer Mother        breast  . Hypertension Father   .  Bipolar disorder Father   . Bipolar disorder Sister     Social History Social History   Tobacco Use  . Smoking status: Never Smoker  . Smokeless tobacco: Never Used  Substance Use Topics  . Alcohol use: Yes  . Drug use: No     Allergies   Codeine, Gluten, and Penicillins   Review of Systems Review of Systems  Constitutional: Negative for chills and fever.  HENT: Negative for congestion, ear pain, sore throat, trouble swallowing and voice change.   Respiratory: Positive for shortness of breath. Negative for cough.   Cardiovascular: Negative for chest pain and palpitations.  Gastrointestinal: Negative for abdominal pain, diarrhea, nausea and vomiting.  Musculoskeletal: Negative for arthralgias, back pain and myalgias.  Skin: Negative for rash.  Neurological: Negative for dizziness, light-headedness and headaches.  All other systems reviewed and are negative.    Physical Exam Triage Vital Signs ED Triage Vitals  Enc Vitals Group     BP  04/29/20 1230 136/90     Pulse Rate 04/29/20 1230 91     Resp --      Temp 04/29/20 1230 98.6 F (37 C)     Temp Source 04/29/20 1230 Oral     SpO2 04/29/20 1230 99 %     Weight --      Height --      Head Circumference --      Peak Flow --      Pain Score 04/29/20 1231 0     Pain Loc --      Pain Edu? --      Excl. in GC? --    No data found.  Updated Vital Signs BP 136/90 (BP Location: Right Arm)   Pulse 91   Temp 98.6 F (37 C) (Oral)   LMP 04/20/2020   SpO2 99%   Visual Acuity Right Eye Distance:   Left Eye Distance:   Bilateral Distance:    Right Eye Near:   Left Eye Near:    Bilateral Near:     Physical Exam Vitals and nursing note reviewed.  Constitutional:      General: She is not in acute distress.    Appearance: She is well-developed. She is not ill-appearing, toxic-appearing or diaphoretic.  HENT:     Head: Normocephalic and atraumatic.     Right Ear: Tympanic membrane and ear canal normal.     Left Ear: Tympanic membrane and ear canal normal.     Nose: Nose normal.     Right Sinus: No maxillary sinus tenderness or frontal sinus tenderness.     Left Sinus: No maxillary sinus tenderness or frontal sinus tenderness.     Mouth/Throat:     Lips: Pink.     Mouth: Mucous membranes are moist.     Pharynx: Oropharynx is clear. Uvula midline.  Cardiovascular:     Rate and Rhythm: Normal rate.  Pulmonary:     Effort: Pulmonary effort is normal.     Breath sounds: Normal breath sounds. No decreased breath sounds, wheezing, rhonchi or rales.  Musculoskeletal:        General: Normal range of motion.     Cervical back: Normal range of motion.  Skin:    General: Skin is warm and dry.  Neurological:     Mental Status: She is alert and oriented to person, place, and time.  Psychiatric:        Behavior: Behavior normal.      UC Treatments / Results  Labs (  all labs ordered are listed, but only abnormal results are displayed) Labs Reviewed - No data to  display  EKG   Radiology DG Chest 2 View  Result Date: 04/29/2020 CLINICAL DATA:  Patient c/o not being able to get a good deep breath since Monday. No cold sx's, other than a cold sore, no cough, thinks a possible virus. EXAM: CHEST - 2 VIEW COMPARISON:  Chest radiograph 11/16/2019 FINDINGS: The heart size and mediastinal contours are within normal limits. The lungs are clear. No pneumothorax or pleural effusion. The visualized skeletal structures are unremarkable. IMPRESSION: No evidence of active disease. Electronically Signed   By: Audie Pinto M.D.   On: 04/29/2020 13:12    Procedures Procedures (including critical care time)  Medications Ordered in UC Medications - No data to display  Initial Impression / Assessment and Plan / UC Course  I have reviewed the triage vital signs and the nursing notes.  Pertinent labs & imaging results that were available during my care of the patient were reviewed by me and considered in my medical decision making (see chart for details).     Pt denies chest pain, no evidence of ACS today Reassured pt of normal vitals and normal CXR Pt declined covid test Doubt PE, no risk factors and normal vitals.  Encouraged f/u with PCP Discussed symptoms that warrant emergent care in the ED. AVS given   Final Clinical Impressions(s) / UC Diagnoses   Final diagnoses:  Shortness of breath  Chest tightness  Anxiety  Stress     Discharge Instructions      Your vitals signs are normal today. Your lungs sound clear and normal chest x-ray, which is reassuring.  If your shortness of breath/chest tightness continues, follow up with your primary care provider this week for recheck of symptoms.  Call 911 or have someone drive you to the hospital if you develop chest pain, worsening shortness of breath, headache, dizziness, or other new concerning symptoms develop.     ED Prescriptions    None     PDMP not reviewed this encounter.   Noe Gens, [place] 04/29/20 1605

## 2020-04-29 NOTE — Discharge Instructions (Signed)
  Your vitals signs are normal today. Your lungs sound clear and normal chest x-ray, which is reassuring.  If your shortness of breath/chest tightness continues, follow up with your primary care provider this week for recheck of symptoms.  Call 911 or have someone drive you to the hospital if you develop chest pain, worsening shortness of breath, headache, dizziness, or other new concerning symptoms develop.

## 2020-05-04 ENCOUNTER — Encounter: Payer: Self-pay | Admitting: Osteopathic Medicine

## 2020-05-04 ENCOUNTER — Ambulatory Visit (INDEPENDENT_AMBULATORY_CARE_PROVIDER_SITE_OTHER): Payer: 59 | Admitting: Osteopathic Medicine

## 2020-05-04 VITALS — BP 133/85 | HR 58 | Temp 98.0°F | Wt 155.0 lb

## 2020-05-04 DIAGNOSIS — Z13220 Encounter for screening for lipoid disorders: Secondary | ICD-10-CM

## 2020-05-04 DIAGNOSIS — F418 Other specified anxiety disorders: Secondary | ICD-10-CM

## 2020-05-04 DIAGNOSIS — F419 Anxiety disorder, unspecified: Secondary | ICD-10-CM

## 2020-05-04 DIAGNOSIS — R0602 Shortness of breath: Secondary | ICD-10-CM | POA: Diagnosis not present

## 2020-05-04 LAB — D-DIMER, QUANTITATIVE: D-Dimer, Quant: <0.19 mcg/mL FEU (ref <0.50)

## 2020-05-04 MED ORDER — ESCITALOPRAM OXALATE 5 MG PO TABS: 5.0000 mg | ORAL_TABLET | Freq: Every day | ORAL | 0 refills | Status: DC

## 2020-05-04 MED ORDER — ALBUTEROL SULFATE HFA 108 (90 BASE) MCG/ACT IN AERS: 2.0000 | INHALATION_SPRAY | Freq: Four times a day (QID) | RESPIRATORY_TRACT | 3 refills | Status: DC | PRN

## 2020-05-04 MED ORDER — CLONAZEPAM 0.5 MG PO TABS: 0.2500 mg | ORAL_TABLET | Freq: Two times a day (BID) | ORAL | 0 refills | Status: DC | PRN

## 2020-05-04 NOTE — Progress Notes (Signed)
Jody Gay is a 39 y.o. female who presents to  [place] at [facility]  today, 05/04/20, seeking care for the following: . Anxiety - last seen for same 08/2017, situational anxiety helped by prn Clonazepam.  Patient has been a bit more concerned over the past couple weeks of noticing some shortness of breath/chest tightness.  Shortness of breath seems to be a bit better over the past week or so, but she is nervous and would like to rule out a blood clot, even though she recognizes this is fairly unlikely.  She does struggle with some situational anxiety but her sister has commented that it may be a bit more pervasive than that.  Patient has been under increased stress especially with home schooling her kids.     ASSESSMENT & PLAN with other pertinent history/findings:  The primary encounter diagnosis was Shortness of breath. Diagnoses of Anxiety, Lipid screening, and Situational anxiety were also pertinent to this visit.  After discussing with patient, I feel pulmonary embolus is not very likely but I am certainly happy to get D-dimer and proceed with work-up just in case.  Sounds like might be a component of airway hyperreactivity versus deconditioning, will trial albuterol as needed exercise though patient is advised that this may cause some jitteriness/anxiety on its own.  I think refilling the clonazepam for sparing use would be fine, she still had pills left over from 2018, probably they are not helping as the active ingredient may have deteriorated to a point where it is ineffective.  Patient is open to trying daily preventive medication/SSRI, will trial Lexapro and follow-up in 1 month.  Depression screen [facility] 2/9 05/04/2020 08/13/2017 07/02/2017  Decreased Interest 1 - 1  Down, Depressed, Hopeless 0 - 1  PHQ - 2 Score 1 - 2  Altered sleeping 1 0 0  Tired, decreased energy 1 1 1   Change in appetite 1 0 1  Feeling bad or failure about yourself  1  0 2  Trouble concentrating 1 0 0  Moving slowly or fidgety/restless 1 0 0  Suicidal thoughts 0 0 0  PHQ-9 Score 7 - 6  Difficult doing work/chores Somewhat difficult - -   GAD 7 : Generalized Anxiety Score 05/04/2020 07/02/2017  Nervous, Anxious, on Edge 3 3  Control/stop worrying 3 3  Worry too much - different things 1 3  Trouble relaxing 3 3  Restless 3 3  Easily annoyed or irritable 1 3  Afraid - awful might happen 3 3  Total GAD 7 Score 17 21  Anxiety Difficulty Somewhat difficult -      There are no Patient Instructions on file for this visit.   Orders Placed This Encounter  Procedures  . D-dimer, quantitative (not at [facility])  . CBC  . COMPLETE METABOLIC PANEL WITH GFR  . TSH  . Lipid panel    Meds ordered this encounter  Medications  . albuterol (VENTOLIN HFA) 108 (90 Base) MCG/ACT inhaler    Sig: Inhale 2 puffs into the lungs every 6 (six) hours as needed for wheezing.    Dispense:  18 g    Refill:  3  . escitalopram (LEXAPRO) 5 MG tablet    Sig: Take 1 tablet (5 mg total) by mouth daily.    Dispense:  90 tablet    Refill:  0  . clonazePAM (KLONOPIN) 0.5 MG tablet    Sig: Take 0.5-1 tablets (0.25-0.5 mg total) by mouth 2 (two) times  daily as needed for anxiety (use sparingly for severe symptoms).    Dispense:  20 tablet    Refill:  0       Follow-up instructions: Return in about 4 weeks (around 06/01/2020) for RECHECK MENTAL HEALTH  - sooner if needed .                                         BP 133/85 (BP Location: Left Arm, Patient Position: Sitting)   Pulse (!) 58   Temp 98 F (36.7 C)   Wt 155 lb (70.3 kg)   LMP 04/20/2020   BMI 25.79 kg/m   Current Meds  Medication Sig  . clonazePAM (KLONOPIN) 0.5 MG tablet Take 0.5-1 tablets (0.25-0.5 mg total) by mouth 2 (two) times daily as needed for anxiety (use sparingly for severe symptoms).  . [DISCONTINUED] clonazePAM (KLONOPIN) 0.5 MG tablet Take 0.5-1 tablets  (0.25-0.5 mg total) by mouth 2 (two) times daily as needed for anxiety (use sparingly for severe symptoms).    No results found for this or any previous visit (from the past 72 hour(s)).  No results found.  Depression screen [facility] 2/9 05/04/2020 08/13/2017 07/02/2017  Decreased Interest 1 - 1  Down, Depressed, Hopeless 0 - 1  PHQ - 2 Score 1 - 2  Altered sleeping 1 0 0  Tired, decreased energy 1 1 1   Change in appetite 1 0 1  Feeling bad or failure about yourself  1 0 2  Trouble concentrating 1 0 0  Moving slowly or fidgety/restless 1 0 0  Suicidal thoughts 0 0 0  PHQ-9 Score 7 - 6  Difficult doing work/chores Somewhat difficult - -    GAD 7 : Generalized Anxiety Score 05/04/2020 07/02/2017  Nervous, Anxious, on Edge 3 3  Control/stop worrying 3 3  Worry too much - different things 1 3  Trouble relaxing 3 3  Restless 3 3  Easily annoyed or irritable 1 3  Afraid - awful might happen 3 3  Total GAD 7 Score 17 21  Anxiety Difficulty Somewhat difficult -    Constitutional:  . VSS, see nurse notes . General Appearance: alert, well-developed, well-nourished, NAD Neck: . No masses, trachea midline . No thyroid enlargement/tenderness/mass appreciated Respiratory: . Normal respiratory effort . No dullness/hyper-resonance to percussion . Breath sounds normal, no wheeze/rhonchi/rales Cardiovascular: . S1/S2 normal, no murmur/rub/gallop auscultated . No lower extremity edema Gastrointestinal: . Nontender, no masses . No hepatomegaly, no splenomegaly . No hernia appreciated Musculoskeletal:  . Gait normal . No clubbing/cyanosis of digits Neurological: . No cranial nerve deficit on limited exam . Motor and sensation intact and symmetric Psychiatric: . Normal judgment/insight . Normal mood and affect   All questions at time of visit were answered - patient instructed to contact office with any additional concerns or updates.  ER/RTC precautions were reviewed with the patient.   Please note: voice recognition software was used to produce this document, and typos may escape review. Please contact Dr. 09/01/2017 for any needed clarifications.

## 2020-05-05 LAB — CBC
HCT: 38.6 % (ref 35.0–45.0)
Hemoglobin: 13.2 g/dL (ref 11.7–15.5)
MCH: 30.4 pg (ref 27.0–33.0)
MCHC: 34.2 g/dL (ref 32.0–36.0)
MCV: 88.9 fL (ref 80.0–100.0)
MPV: 8.7 fL (ref 7.5–12.5)
Platelets: 298 10*3/uL (ref 140–400)
RBC: 4.34 10*6/uL (ref 3.80–5.10)
RDW: 12 % (ref 11.0–15.0)
WBC: 5.4 10*3/uL (ref 3.8–10.8)

## 2020-05-05 LAB — LIPID PANEL
Cholesterol: 218 mg/dL — ABNORMAL HIGH (ref <200)
HDL: 51 mg/dL (ref >=50)
LDL Cholesterol (Calc): 151 mg/dL (calc) — ABNORMAL HIGH
Non-HDL Cholesterol (Calc): 167 mg/dL (calc) — ABNORMAL HIGH (ref <130)
Total CHOL/HDL Ratio: 4.3 (calc) (ref <5.0)
Triglycerides: 66 mg/dL (ref <150)

## 2020-05-05 LAB — COMPLETE METABOLIC PANEL WITH GFR
AG Ratio: 1.5 (calc) (ref 1.0–2.5)
ALT: 11 U/L (ref 6–29)
AST: 12 U/L (ref 10–30)
Albumin: 4.4 g/dL (ref 3.6–5.1)
Alkaline phosphatase (APISO): 42 U/L (ref 31–125)
BUN: 10 mg/dL (ref 7–25)
CO2: 27 mmol/L (ref 20–32)
Calcium: 9.5 mg/dL (ref 8.6–10.2)
Chloride: 105 mmol/L (ref 98–110)
Creat: 0.71 mg/dL (ref 0.50–1.10)
GFR, Est African American: 125 mL/min/{1.73_m2} (ref >=60)
GFR, Est Non African American: 108 mL/min/{1.73_m2} (ref >=60)
Globulin: 3 g/dL (calc) (ref 1.9–3.7)
Glucose, Bld: 107 mg/dL (ref 65–139)
Potassium: 4.2 mmol/L (ref 3.5–5.3)
Sodium: 139 mmol/L (ref 135–146)
Total Bilirubin: 0.5 mg/dL (ref 0.2–1.2)
Total Protein: 7.4 g/dL (ref 6.1–8.1)

## 2020-05-05 LAB — TSH: TSH: 0.99 mIU/L

## 2020-06-01 ENCOUNTER — Other Ambulatory Visit: Payer: Self-pay

## 2020-06-01 ENCOUNTER — Encounter: Payer: Self-pay | Admitting: Osteopathic Medicine

## 2020-06-01 ENCOUNTER — Ambulatory Visit (INDEPENDENT_AMBULATORY_CARE_PROVIDER_SITE_OTHER): Payer: 59 | Admitting: Osteopathic Medicine

## 2020-06-01 VITALS — BP 122/77 | HR 83 | Wt 160.0 lb

## 2020-06-01 DIAGNOSIS — F419 Anxiety disorder, unspecified: Secondary | ICD-10-CM

## 2020-06-01 MED ORDER — ESCITALOPRAM OXALATE 5 MG PO TABS: 5.0000 mg | ORAL_TABLET | Freq: Every day | ORAL | 0 refills | Status: DC

## 2020-06-01 NOTE — Progress Notes (Signed)
Jody Gay is a 39 y.o. female who presents to  [facility] at [facility]  today, 06/01/20, seeking care for the following: Recheck breathing and mental health    Last visit 05/04/20:Anxiety - last seen for same 08/2017, situational anxiety helped by prn Clonazepam.  Patient had been a bit more concerned over the past couple weeks of noticing some shortness of breath/chest tightness.  Was nervous and wanted to rule out a blood clot, even though she recognized this is fairly unlikely and SOB was iproved.  She does struggle with some situational anxiety but her sister has commented that it may be a bit more pervasive than that.  Patient has been under increased stress especially with home schooling her kids. --> D Dimer r/o PE, trial albuterol, refill Klonopin, trial Lexapro 5 mg  Today 06/01/20: doing much better, see PHQ/GAD below. Would like to stay at 5 mg dose. Working on getting back to an exercise regimen and hopeful this will also help    Depression screen [facility] 2/9 06/01/2020 05/04/2020 08/13/2017  Decreased Interest 0 1 -  Down, Depressed, Hopeless 0 0 -  PHQ - 2 Score 0 1 -  Altered sleeping 1 1 0  Tired, decreased energy 0 1 1  Change in appetite 0 1 0  Feeling bad or failure about yourself  0 1 0  Trouble concentrating 0 1 0  Moving slowly or fidgety/restless 0 1 0  Suicidal thoughts 0 0 0  PHQ-9 Score 1 7 -  Difficult doing work/chores Not difficult at all Somewhat difficult -   GAD 7 : Generalized Anxiety Score 06/01/2020 05/04/2020 07/02/2017  Nervous, Anxious, on Edge 0 3 3  Control/stop worrying 0 3 3  Worry too much - different things 0 1 3  Trouble relaxing 1 3 3   Restless 1 3 3   Easily annoyed or irritable 1 1 3   Afraid - awful might happen 0 3 3  Total GAD 7 Score 3 17 21   Anxiety Difficulty Not difficult at all Somewhat difficult -     ASSESSMENT & PLAN with other pertinent findings:  The encounter diagnosis was Anxiety.     Patient Instructions  Plan:  Will stay on current dose  Continue for at least another 3 months  Can try coming off Rx in the fall if desired  If would like to try higher dose, or any other changes, let me know!       No orders of the defined types were placed in this encounter.   Meds ordered this encounter  Medications  . escitalopram (LEXAPRO) 5 MG tablet    Sig: Take 1 tablet (5 mg total) by mouth daily.    Dispense:  90 tablet    Refill:  0            Follow-up instructions: Return in about 3 months (around 09/01/2020) for VIRTUAL VISIT or IN-OFFICE VISIT - follow up on mental health, see me sooner if needed! .                                         BP 122/77 (BP Location: Left Arm, Patient Position: Sitting)   Pulse 83   Wt 160 lb (72.6 kg)   SpO2 97%   BMI 26.63 kg/m   Current Meds  Medication Sig  . albuterol (VENTOLIN HFA) 108 (90 Base)  MCG/ACT inhaler Inhale 2 puffs into the lungs every 6 (six) hours as needed for wheezing.  . clonazePAM (KLONOPIN) 0.5 MG tablet Take 0.5-1 tablets (0.25-0.5 mg total) by mouth 2 (two) times daily as needed for anxiety (use sparingly for severe symptoms).  Marland Kitchen escitalopram (LEXAPRO) 5 MG tablet Take 1 tablet (5 mg total) by mouth daily.  . [DISCONTINUED] escitalopram (LEXAPRO) 5 MG tablet Take 1 tablet (5 mg total) by mouth daily.    No results found for this or any previous visit (from the past 72 hour(s)).  No results found.     All questions at time of visit were answered - patient instructed to contact office with any additional concerns or updates.  ER/RTC precautions were reviewed with the patient as applicable.   Please note: voice recognition software was used to produce this document, and typos may escape review. Please contact Dr. Lyn Hollingshead for any needed clarifications.

## 2020-06-01 NOTE — Patient Instructions (Addendum)
Plan:  Will stay on current dose  Continue for at least another 3 months  Can try coming off Rx in the fall if desired  If would like to try higher dose, or any other changes, let me know!

## 2020-09-04 ENCOUNTER — Ambulatory Visit: Payer: 59 | Admitting: Osteopathic Medicine

## 2020-10-08 ENCOUNTER — Other Ambulatory Visit: Payer: Self-pay | Admitting: Osteopathic Medicine

## 2020-12-04 ENCOUNTER — Encounter: Payer: Self-pay | Admitting: Sports Medicine

## 2020-12-04 ENCOUNTER — Ambulatory Visit (INDEPENDENT_AMBULATORY_CARE_PROVIDER_SITE_OTHER): Payer: 59

## 2020-12-04 ENCOUNTER — Ambulatory Visit (INDEPENDENT_AMBULATORY_CARE_PROVIDER_SITE_OTHER): Payer: 59 | Admitting: Sports Medicine

## 2020-12-04 ENCOUNTER — Other Ambulatory Visit: Payer: Self-pay

## 2020-12-04 DIAGNOSIS — J45990 Exercise induced bronchospasm: Secondary | ICD-10-CM | POA: Diagnosis not present

## 2020-12-04 MED ORDER — ALBUTEROL SULFATE HFA 108 (90 BASE) MCG/ACT IN AERS: INHALATION_SPRAY | RESPIRATORY_TRACT | 11 refills | Status: DC

## 2020-12-04 NOTE — Assessment & Plan Note (Signed)
40 year old female, she has a history of generalized anxiety, historically well controlled with Lexapro, self discontinued, anxiety increased to some degree but not to the point where she feels like she needs medication.  She also gets occasional episodes where she has mild shortness of breath, appropriately worked up by Dr. Lyn Hollingshead with labs including a D-dimer and a chest x-ray. All of the above were unremarkable. She does find that when she gets a little bit sick she will get a cold sore, a mild sore throat and have some difficulty catching her breath. I think this is a simple viral process, we will add some albuterol for exercise-induced bronchoconstriction that she can also use when she gets mild shortness of breath when ill. Adding a chest x-ray. I do not really think we need any labs, negative Denna Haggard' sign bilaterally. Exam was overall clear. Return as needed.

## 2020-12-04 NOTE — Progress Notes (Signed)
    Procedures performed today:    None.  Independent interpretation of notes and tests performed by another provider:   None.  Brief History, Exam, Impression, and Recommendations:    Exercise-induced bronchoconstriction 40 year old female, she has a history of generalized anxiety, historically well controlled with Lexapro, self discontinued, anxiety increased to some degree but not to the point where she feels like she needs medication.  She also gets occasional episodes where she has mild shortness of breath, appropriately worked up by Dr. Lyn Hollingshead with labs including a D-dimer and a chest x-ray. All of the above were unremarkable. She does find that when she gets a little bit sick she will get a cold sore, a mild sore throat and have some difficulty catching her breath. I think this is a simple viral process, we will add some albuterol for exercise-induced bronchoconstriction that she can also use when she gets mild shortness of breath when ill. Adding a chest x-ray. I do not really think we need any labs, negative Denna Haggard' sign bilaterally. Exam was overall clear. Return as needed.    ___________________________________________ [name]. Benjamin Stain, M.D., ABFM., CAQSM. Primary Care and Sports Medicine [facility]  Adjunct Instructor of Family Medicine  [employer]

## 2021-01-22 ENCOUNTER — Encounter: Payer: Self-pay | Admitting: Osteopathic Medicine

## 2021-02-27 ENCOUNTER — Other Ambulatory Visit: Payer: Self-pay

## 2021-02-27 ENCOUNTER — Encounter: Payer: Self-pay | Admitting: Osteopathic Medicine

## 2021-02-27 ENCOUNTER — Ambulatory Visit (INDEPENDENT_AMBULATORY_CARE_PROVIDER_SITE_OTHER): Payer: 59 | Admitting: Osteopathic Medicine

## 2021-02-27 VITALS — BP 135/83 | HR 75 | Temp 98.0°F | Wt 159.1 lb

## 2021-02-27 DIAGNOSIS — Z0000 Encounter for general adult medical examination without abnormal findings: Secondary | ICD-10-CM

## 2021-02-27 DIAGNOSIS — Z8669 Personal history of other diseases of the nervous system and sense organs: Secondary | ICD-10-CM

## 2021-02-27 DIAGNOSIS — J45990 Exercise induced bronchospasm: Secondary | ICD-10-CM

## 2021-02-27 DIAGNOSIS — F411 Generalized anxiety disorder: Secondary | ICD-10-CM | POA: Diagnosis not present

## 2021-02-27 LAB — LIPID PANEL
HDL: 51 mg/dL (ref >=50)
LDL Cholesterol (Calc): 136 mg/dL (calc) — ABNORMAL HIGH
Non-HDL Cholesterol (Calc): 159 mg/dL (calc) — ABNORMAL HIGH (ref <130)

## 2021-02-27 LAB — COMPLETE METABOLIC PANEL WITH GFR
AST: 18 U/L (ref 10–30)
Albumin: 4.4 g/dL (ref 3.6–5.1)
BUN: 7 mg/dL (ref 7–25)
Creat: 0.76 mg/dL (ref 0.50–1.10)
Glucose, Bld: 107 mg/dL (ref 65–139)

## 2021-02-27 NOTE — Patient Instructions (Addendum)
General Preventive Care  Most recent routine screening labs: ordered.   Blood pressure goal 130/80 or less.   Tobacco: don't!   Alcohol: responsible moderation is ok for most adults - if you have concerns about your alcohol intake, please talk to me!   Exercise: as tolerated to reduce risk of cardiovascular disease and diabetes. Strength training will also prevent osteoporosis.   Mental health: if need for mental health care (medicines, counseling, other), or concerns about moods, please let me know!   Sexual / Reproductive health: if need for STD testing, or if concerns with libido/pain problems, please let me know! If you need to discuss family planning, please let me know!   Advanced Directive: Living Will and/or Healthcare Power of Attorney recommended for all adults, regardless of age or health.  Vaccines  Flu vaccine: for almost everyone, every fall.   Shingles vaccine: after age 55.   Pneumonia vaccines: after age 4.  Tetanus booster: every 10 years / 3rd trimester of pregnancy  COVID vaccine and boosters: STRONGLY RECOMMENDED  Cancer screenings   Colon cancer screening: for everyone age 44-75.  Breast cancer screening: mammogram at age 14 - genetic testing considered w/ family history   Cervical cancer screening: Pap every 5 years if fnormal  Lung cancer screening: not needed for non-smokers  Infection screenings  . HIV: recommended screening at least once age 56-65 . Gonorrhea/Chlamydia: screening as needed . Hepatitis C: recommended once for everyone age 76-75 . TB: certain at-risk populations, or depending on work requirements and/or travel history

## 2021-02-27 NOTE — Progress Notes (Signed)
Jody Gay is a 40 y.o. female who presents to  [facility] at [facility]  today, 02/27/21, seeking care for the following:  . Annual physiacl . Breathing / respiratory concerns: feeling frequent chest tightness, feels difficult to take a breath in. No lightheadedness/presyncope/LOC. Uncertain how much might be d/t anxiety      ASSESSMENT & PLAN with other pertinent findings:  The primary encounter diagnosis was Annual physical exam. Diagnoses of Generalized anxiety disorder, Exercise-induced bronchoconstriction, and History of migraine were also pertinent to this visit.    Patient Instructions  General Preventive Care  Most recent routine screening labs: ordered.   Blood pressure goal 130/80 or less.   Tobacco: don't!   Alcohol: responsible moderation is ok for most adults - if you have concerns about your alcohol intake, please talk to me!   Exercise: as tolerated to reduce risk of cardiovascular disease and diabetes. Strength training will also prevent osteoporosis.   Mental health: if need for mental health care (medicines, counseling, other), or concerns about moods, please let me know!   Sexual / Reproductive health: if need for STD testing, or if concerns with libido/pain problems, please let me know! If you need to discuss family planning, please let me know!   Advanced Directive: Living Will and/or Healthcare Power of Attorney recommended for all adults, regardless of age or health.  Vaccines  Flu vaccine: for almost everyone, every fall.   Shingles vaccine: after age 22.   Pneumonia vaccines: after age 19.  Tetanus booster: every 10 years / 3rd trimester of pregnancy  COVID vaccine and boosters: STRONGLY RECOMMENDED  Cancer screenings   Colon cancer screening: for everyone age 49-75.  Breast cancer screening: mammogram at age 72 - genetic testing considered w/ family history   Cervical cancer screening: Pap  every 5 years if fnormal  Lung cancer screening: not needed for non-smokers  Infection screenings  . HIV: recommended screening at least once age 3-65 . Gonorrhea/Chlamydia: screening as needed . Hepatitis C: recommended once for everyone age 9-75 . TB: certain at-risk populations, or depending on work requirements and/or travel history   Orders Placed This Encounter  Procedures  . CBC  . COMPLETE METABOLIC PANEL WITH GFR  . Lipid panel  . TSH    No orders of the defined types were placed in this encounter.    See below for relevant physical exam findings  See below for recent lab and imaging results reviewed  Medications, allergies, PMH, PSH, SocH, FamH reviewed below    Follow-up instructions: Return in about 1 year (around 02/27/2022) for [place] (call week prior to visit for lab orders).                                        Exam:  BP 135/83 (BP Location: Left Arm, Patient Position: Sitting, Cuff Size: Normal)   Pulse 75   Temp 98 F (36.7 C) (Oral)   Wt 159 lb 1.9 oz (72.2 kg)   BMI 26.48 kg/m   Constitutional: VS see above. General Appearance: alert, well-developed, well-nourished, NAD  Neck: No masses, trachea midline.   Respiratory: Normal respiratory effort. no wheeze, no rhonchi, no rales  Cardiovascular: S1/S2 normal, no murmur, no rub/gallop auscultated. RRR.   Musculoskeletal: Gait normal. Symmetric and independent movement of all extremities  Abdominal: non-tender, non-distended, no appreciable organomegaly, neg Murphy's, BS WNLx4  Neurological: Normal balance/coordination. No tremor.  Skin: warm, dry, intact.   Psychiatric: Normal judgment/insight. Normal mood and affect. Oriented x3.   Current Meds  Medication Sig  . albuterol (VENTOLIN HFA) 108 (90 Base) MCG/ACT inhaler 2 puffs 15 minutes before physical activity, may also use for episodes of mild shortness of breath.  . clonazePAM (KLONOPIN) 0.5 MG  tablet Take 0.5-1 tablets (0.25-0.5 mg total) by mouth 2 (two) times daily as needed for anxiety (use sparingly for severe symptoms).    Allergies  Allergen Reactions  . Codeine   . Gluten   . Penicillins     Patient Active Problem List   Diagnosis Date Noted  . Exercise-induced bronchoconstriction 12/04/2020  . Numbness of toes 12/28/2019  . Hypercalcemia 11/17/2019  . Left shoulder pain 11/16/2019  . Systolic murmur 11/16/2019  . Left flank pain 11/16/2019  . Ocular migraine 08/13/2017  . Situational anxiety 07/03/2017  . History of high cholesterol 07/03/2017  . Generalized anxiety disorder 07/03/2017    Family History  Problem Relation Age of Onset  . Cancer Mother        breast  . Hypertension Father   . Bipolar disorder Father   . Bipolar disorder Sister     Social History   Tobacco Use  Smoking Status Never Smoker  Smokeless Tobacco Never Used    Past Surgical History:  Procedure Laterality Date  . CESAREAN SECTION       There is no immunization history on file for this patient.  Recent Results (from the past 2160 hour(s))  CBC     Status: None   Collection Time: 02/27/21 12:00 AM  Result Value Ref Range   WBC 5.6 3.8 - 10.8 Thousand/uL   RBC 4.75 3.80 - 5.10 Million/uL   Hemoglobin 14.2 11.7 - 15.5 g/dL   HCT 97.3 53.2 - 99.2 %   MCV 88.6 80.0 - 100.0 fL   MCH 29.9 27.0 - 33.0 pg   MCHC 33.7 32.0 - 36.0 g/dL   RDW 42.6 83.4 - 19.6 %   Platelets 321 140 - 400 Thousand/uL   MPV 8.8 7.5 - 12.5 fL  COMPLETE METABOLIC PANEL WITH GFR     Status: None   Collection Time: 02/27/21 12:00 AM  Result Value Ref Range   Glucose, Bld 107 65 - 139 mg/dL    Comment: .        Non-fasting reference interval .    BUN 7 7 - 25 mg/dL   Creat 2.22 9.79 - 8.92 mg/dL   GFR, Est Non African American 100 > OR = 60 mL/min/1.32m2   GFR, Est African American 115 > OR = 60 mL/min/1.54m2   BUN/Creatinine Ratio NOT APPLICABLE 6 - 22 (calc)   Sodium 139 135 - 146  mmol/L   Potassium 4.0 3.5 - 5.3 mmol/L   Chloride 105 98 - 110 mmol/L   CO2 25 20 - 32 mmol/L   Calcium 9.5 8.6 - 10.2 mg/dL   Total Protein 7.7 6.1 - 8.1 g/dL   Albumin 4.4 3.6 - 5.1 g/dL   Globulin 3.3 1.9 - 3.7 g/dL (calc)   AG Ratio 1.3 1.0 - 2.5 (calc)   Total Bilirubin 0.3 0.2 - 1.2 mg/dL   Alkaline phosphatase (APISO) 66 31 - 125 U/L   AST 18 10 - 30 U/L   ALT 16 6 - 29 U/L  Lipid panel     Status: Abnormal   Collection Time: 02/27/21 12:00 AM  Result Value Ref Range  Cholesterol 210 (H) <200 mg/dL   HDL 51 > OR = 50 mg/dL   Triglycerides 628 <315 mg/dL   LDL Cholesterol (Calc) 136 (H) mg/dL (calc)    Comment: Reference range: <100 . Desirable range <100 mg/dL for primary prevention;   <70 mg/dL for patients with CHD or diabetic patients  with > or = 2 CHD risk factors. Marland Kitchen LDL-C is now calculated using the Martin-Hopkins  calculation, which is a validated novel method providing  better accuracy than the Friedewald equation in the  estimation of LDL-C.  Horald Pollen et al. Lenox Ahr. 1761;607(37): 2061-2068  (http://education.[facility].com/faq/FAQ164)    Total CHOL/HDL Ratio 4.1 <5.0 (calc)   Non-HDL Cholesterol (Calc) 159 (H) <130 mg/dL (calc)    Comment: For patients with diabetes plus 1 major ASCVD risk  factor, treating to a non-HDL-C goal of <100 mg/dL  (LDL-C of <10 mg/dL) is considered a therapeutic  option.   TSH     Status: None   Collection Time: 02/27/21 12:00 AM  Result Value Ref Range   TSH 2.36 mIU/L    Comment:           Reference Range .           > or = 20 Years  0.40-4.50 .                Pregnancy Ranges           First trimester    0.26-2.66           Second trimester   0.55-2.73           Third trimester    0.43-2.91     No results found.     All questions at time of visit were answered - patient instructed to contact office with any additional concerns or updates. ER/RTC precautions were reviewed with the patient as applicable.    Please note: manual typing as well as voice recognition software may have been used to produce this document - typos may escape review. Please contact Dr. Lyn Hollingshead for any needed clarifications.

## 2021-02-28 LAB — COMPLETE METABOLIC PANEL WITH GFR
AG Ratio: 1.3 (calc) (ref 1.0–2.5)
ALT: 16 U/L (ref 6–29)
Alkaline phosphatase (APISO): 66 U/L (ref 31–125)
CO2: 25 mmol/L (ref 20–32)
Calcium: 9.5 mg/dL (ref 8.6–10.2)
Chloride: 105 mmol/L (ref 98–110)
GFR, Est African American: 115 mL/min/{1.73_m2} (ref >=60)
GFR, Est Non African American: 100 mL/min/{1.73_m2} (ref >=60)
Globulin: 3.3 g/dL (calc) (ref 1.9–3.7)
Potassium: 4 mmol/L (ref 3.5–5.3)
Sodium: 139 mmol/L (ref 135–146)
Total Bilirubin: 0.3 mg/dL (ref 0.2–1.2)
Total Protein: 7.7 g/dL (ref 6.1–8.1)

## 2021-02-28 LAB — CBC
HCT: 42.1 % (ref 35.0–45.0)
Hemoglobin: 14.2 g/dL (ref 11.7–15.5)
MCH: 29.9 pg (ref 27.0–33.0)
MCHC: 33.7 g/dL (ref 32.0–36.0)
MCV: 88.6 fL (ref 80.0–100.0)
MPV: 8.8 fL (ref 7.5–12.5)
Platelets: 321 10*3/uL (ref 140–400)
RBC: 4.75 10*6/uL (ref 3.80–5.10)
RDW: 12.3 % (ref 11.0–15.0)
WBC: 5.6 10*3/uL (ref 3.8–10.8)

## 2021-02-28 LAB — LIPID PANEL
Cholesterol: 210 mg/dL — ABNORMAL HIGH (ref <200)
Total CHOL/HDL Ratio: 4.1 (calc) (ref <5.0)
Triglycerides: 120 mg/dL (ref <150)

## 2021-02-28 LAB — TSH: TSH: 2.36 mIU/L

## 2021-03-13 ENCOUNTER — Other Ambulatory Visit: Payer: Self-pay

## 2021-03-13 ENCOUNTER — Ambulatory Visit (INDEPENDENT_AMBULATORY_CARE_PROVIDER_SITE_OTHER): Payer: BC Managed Care – PPO | Admitting: Osteopathic Medicine

## 2021-03-13 VITALS — BP 141/76 | HR 78 | Temp 98.7°F | Resp 20 | Ht 65.0 in | Wt 158.0 lb

## 2021-03-13 DIAGNOSIS — F411 Generalized anxiety disorder: Secondary | ICD-10-CM

## 2021-03-13 DIAGNOSIS — J45990 Exercise induced bronchospasm: Secondary | ICD-10-CM | POA: Diagnosis not present

## 2021-03-13 MED ORDER — ALBUTEROL SULFATE HFA 108 (90 BASE) MCG/ACT IN AERS: 4.0000 | INHALATION_SPRAY | Freq: Once | RESPIRATORY_TRACT | Status: AC

## 2021-03-13 MED ORDER — ESCITALOPRAM OXALATE 5 MG PO TABS: 5.0000 mg | ORAL_TABLET | Freq: Every day | ORAL | 1 refills | Status: DC

## 2021-03-13 NOTE — Progress Notes (Signed)
Jody Gay is a 40 y.o. female who presents to  [facility] Primary Care & Sports Medicine at [facility]  today, 03/13/21, seeking care for the following:  . Shortness of breath, spirometry.  Spirometry negative, no evidence for COPD/asthma.  Patient has been consistently experiencing feelings of chest tightness, occasionally will wake up with this.  She exercises regularly and does not have chest pain/pressure on exertion.  Chest tightness sensations seem to happen at random.  Was not happening for a while while she was on Lexapro, feels different from the chest tightness that she gets with exercise/exercise-induced asthma.     ASSESSMENT & PLAN with other pertinent findings:  The primary encounter diagnosis was Exercise-induced bronchoconstriction. A diagnosis of Generalized anxiety disorder was also pertinent to this visit.   Discussion with patient today regarding options for treatment.  I think her shortness of breath sensation is probably multifactorial with somatic symptoms of anxiety/lung hypersensitivity to triggers such as cold, pollen, exercise.  Option to get back on antianxiety medications plus or minus maintenance inhaler.  Patient would like to try getting back on Lexapro at low-dose.  There are no Patient Instructions on file for this visit.  Orders Placed This Encounter  Procedures  . PR EVAL OF BRONCHOSPASM    Meds ordered this encounter  Medications  . albuterol (VENTOLIN HFA) 108 (90 Base) MCG/ACT inhaler 4 puff  . escitalopram (LEXAPRO) 5 MG tablet    Sig: Take 1 tablet (5 mg total) by mouth at bedtime.    Dispense:  90 tablet    Refill:  1     See below for relevant physical exam findings  See below for recent lab and imaging results reviewed  Medications, allergies, PMH, PSH, SocH, FamH reviewed below    Follow-up instructions: Return in about 2 months (around 05/13/2021) for VIRTUAL *OR* IN-OFFICE VISIT, RECHECK ON LEXAPRO, SEE [place] SOONER IF  NEEDED. CALL/MESSAGE W/ QUESTIONS.                                        Exam:  BP (!) 141/76 (BP Location: Left Arm, Patient Position: Sitting, Cuff Size: Normal)   Pulse 78   Temp 98.7 F (37.1 C) (Oral)   Resp 20   Ht 5\' 5"  (1.651 m)   Wt 158 lb (71.7 kg)   SpO2 99%   BMI 26.29 kg/m   Constitutional: VS see above. General Appearance: alert, well-developed, well-nourished, NAD  Neck: No masses, trachea midline.   Respiratory: Normal respiratory effort. no wheeze, no rhonchi, no rales  Cardiovascular: S1/S2 normal, no murmur, no rub/gallop auscultated. RRR.   Musculoskeletal: Gait normal. Symmetric and independent movement of all extremities  Neurological: Normal balance/coordination. No tremor.  Skin: warm, dry, intact.   Psychiatric: Normal judgment/insight. Normal mood and affect. Oriented x3.   Current Meds  Medication Sig  . escitalopram (LEXAPRO) 5 MG tablet Take 1 tablet (5 mg total) by mouth at bedtime.   Current Facility-Administered Medications for the 03/13/21 encounter (Office Visit) with 03/15/21, DO  Medication  . albuterol (VENTOLIN HFA) 108 (90 Base) MCG/ACT inhaler 4 puff    Allergies  Allergen Reactions  . Codeine   . Gluten   . Penicillins     Patient Active Problem List   Diagnosis Date Noted  . Exercise-induced bronchoconstriction 12/04/2020  . Numbness of toes 12/28/2019  . Hypercalcemia 11/17/2019  . Left  shoulder pain 11/16/2019  . Systolic murmur 11/16/2019  . Left flank pain 11/16/2019  . Ocular migraine 08/13/2017  . Situational anxiety 07/03/2017  . History of high cholesterol 07/03/2017  . Generalized anxiety disorder 07/03/2017    Family History  Problem Relation Age of Onset  . Cancer Mother        breast  . Hypertension Father   . Bipolar disorder Father   . Bipolar disorder Sister     Social History   Tobacco Use  Smoking Status Never Smoker  Smokeless Tobacco  Never Used    Past Surgical History:  Procedure Laterality Date  . CESAREAN SECTION       There is no immunization history on file for this patient.  Recent Results (from the past 2160 hour(s))  CBC     Status: None   Collection Time: 02/27/21 12:00 AM  Result Value Ref Range   WBC 5.6 3.8 - 10.8 Thousand/uL   RBC 4.75 3.80 - 5.10 Million/uL   Hemoglobin 14.2 11.7 - 15.5 g/dL   HCT 78.2 95.6 - 21.3 %   MCV 88.6 80.0 - 100.0 fL   MCH 29.9 27.0 - 33.0 pg   MCHC 33.7 32.0 - 36.0 g/dL   RDW 08.6 57.8 - 46.9 %   Platelets 321 140 - 400 Thousand/uL   MPV 8.8 7.5 - 12.5 fL  COMPLETE METABOLIC PANEL WITH GFR     Status: None   Collection Time: 02/27/21 12:00 AM  Result Value Ref Range   Glucose, Bld 107 65 - 139 mg/dL    Comment: .        Non-fasting reference interval .    BUN 7 7 - 25 mg/dL   Creat 6.29 5.28 - 4.13 mg/dL   GFR, Est Non African American 100 > OR = 60 mL/min/1.32m2   GFR, Est African American 115 > OR = 60 mL/min/1.35m2   BUN/Creatinine Ratio NOT APPLICABLE 6 - 22 (calc)   Sodium 139 135 - 146 mmol/L   Potassium 4.0 3.5 - 5.3 mmol/L   Chloride 105 98 - 110 mmol/L   CO2 25 20 - 32 mmol/L   Calcium 9.5 8.6 - 10.2 mg/dL   Total Protein 7.7 6.1 - 8.1 g/dL   Albumin 4.4 3.6 - 5.1 g/dL   Globulin 3.3 1.9 - 3.7 g/dL (calc)   AG Ratio 1.3 1.0 - 2.5 (calc)   Total Bilirubin 0.3 0.2 - 1.2 mg/dL   Alkaline phosphatase (APISO) 66 31 - 125 U/L   AST 18 10 - 30 U/L   ALT 16 6 - 29 U/L  Lipid panel     Status: Abnormal   Collection Time: 02/27/21 12:00 AM  Result Value Ref Range   Cholesterol 210 (H) <200 mg/dL   HDL 51 > OR = 50 mg/dL   Triglycerides 244 <010 mg/dL   LDL Cholesterol (Calc) 136 (H) mg/dL (calc)    Comment: Reference range: <100 . Desirable range <100 mg/dL for primary prevention;   <70 mg/dL for patients with CHD or diabetic patients  with > or = 2 CHD risk factors. Marland Kitchen LDL-C is now calculated using the Martin-Hopkins  calculation, which is a  validated novel method providing  better accuracy than the Friedewald equation in the  estimation of LDL-C.  Horald Pollen et al. Lenox Ahr. 2725;366(44): 2061-2068  (http://education.[facility].com/faq/FAQ164)    Total CHOL/HDL Ratio 4.1 <5.0 (calc)   Non-HDL Cholesterol (Calc) 159 (H) <130 mg/dL (calc)    Comment: For patients with  diabetes plus 1 major ASCVD risk  factor, treating to a non-HDL-C goal of <100 mg/dL  (LDL-C of <04 mg/dL) is considered a therapeutic  option.   TSH     Status: None   Collection Time: 02/27/21 12:00 AM  Result Value Ref Range   TSH 2.36 mIU/L    Comment:           Reference Range .           > or = 20 Years  0.40-4.50 .                Pregnancy Ranges           First trimester    0.26-2.66           Second trimester   0.55-2.73           Third trimester    0.43-2.91     No results found.     All questions at time of visit were answered - patient instructed to contact office with any additional concerns or updates. ER/RTC precautions were reviewed with the patient as applicable.   Please note: manual typing as well as voice recognition software may have been used to produce this document - typos may escape review. Please contact Dr. Lyn Hollingshead for any needed clarifications.

## 2021-03-19 ENCOUNTER — Encounter: Payer: Self-pay | Admitting: Certified Nurse Midwife

## 2021-03-19 ENCOUNTER — Other Ambulatory Visit: Payer: Self-pay

## 2021-03-19 ENCOUNTER — Other Ambulatory Visit (HOSPITAL_COMMUNITY)
Admission: RE | Admit: 2021-03-19 | Discharge: 2021-03-19 | Disposition: A | Discharge status: Home / Self Care | Payer: BC Managed Care – PPO | Source: Ambulatory Visit | Attending: Certified Nurse Midwife | Admitting: Certified Nurse Midwife

## 2021-03-19 ENCOUNTER — Ambulatory Visit (INDEPENDENT_AMBULATORY_CARE_PROVIDER_SITE_OTHER): Payer: BC Managed Care – PPO | Admitting: Certified Nurse Midwife

## 2021-03-19 VITALS — BP 126/77 | HR 80 | Resp 16 | Ht 65.0 in | Wt 159.0 lb

## 2021-03-19 DIAGNOSIS — Z01419 Encounter for gynecological examination (general) (routine) without abnormal findings: Secondary | ICD-10-CM

## 2021-03-19 DIAGNOSIS — Z803 Family history of malignant neoplasm of breast: Secondary | ICD-10-CM

## 2021-03-19 NOTE — Progress Notes (Signed)
Gynecology Annual Exam   History of Present Illness: Jody Gay is a 40 y.o. married female presenting for an annual exam. She has no complaints today. She is sexually active. She denies dyspareunia. She does perform self breast exams. There is notable family history of breast cancer, her mother was dx at age 26 and passed from metastatic at age 40, she was negative for BRCA. She is asking about a baseline mammogram. No family hx of ovarian cancer. She declines STD screening.  Past Medical History:  Past Medical History:  Diagnosis Date  . Anxiety   . Gestational age less than 24 weeks    14weeks  . Hyperlipidemia     Past Surgical History:  Past Surgical History:  Procedure Laterality Date  . CESAREAN SECTION      Gynecologic History:  LMP: Patient's last menstrual period was 02/22/2021. Average Interval: regular Heavy Menses: no Clots: no Intermenstrual Bleeding: no Postcoital Bleeding: no Dysmenorrhea: no Contraception: none; reports husband takes testosterone Last Pap: completed on 2019 (not on file), pt reprots normal  Mammogram: never  Obstetric History: G3P2100  Family History:  Family History  Problem Relation Age of Onset  . Skin cancer Mother   . Breast cancer Mother 42       passed at age 37  . Hypertension Father   . Bipolar disorder Father   . Bipolar disorder Sister   . Colon cancer Paternal Grandmother   . Skin cancer Maternal Grandmother     Social History:  Social History   Socioeconomic History  . Marital status: Married    Spouse name: Not on file  . Number of children: Not on file  . Years of education: Not on file  . Highest education level: Not on file  Occupational History  . Occupation: homemaker  Tobacco Use  . Smoking status: Never Smoker  . Smokeless tobacco: Never Used  Vaping Use  . Vaping Use: Never used  Substance and Sexual Activity  . Alcohol use: Yes  . Drug use: No  . Sexual activity: Yes    Birth  control/protection: None  Other Topics Concern  . Not on file  Social History Narrative  . Not on file   Social Determinants of Health   Financial Resource Strain: Not on file  Food Insecurity: Not on file  Transportation Needs: Not on file  Physical Activity: Not on file  Stress: Not on file  Social Connections: Not on file  Intimate Partner Violence: Not on file    Allergies:  Allergies  Allergen Reactions  . Codeine   . Gluten   . Penicillins     Medications: Prior to Admission medications   Medication Sig Start Date End Date Taking? Authorizing Provider  albuterol (VENTOLIN HFA) 108 (90 Base) MCG/ACT inhaler 2 puffs 15 minutes before physical activity, may also use for episodes of mild shortness of breath. 12/04/20  Yes Silverio Decamp, MD  cetirizine (ZYRTEC) 10 MG tablet Take 10 mg by mouth daily.   Yes [provider]  clonazePAM (KLONOPIN) 0.5 MG tablet Take 0.5-1 tablets (0.25-0.5 mg total) by mouth 2 (two) times daily as needed for anxiety (use sparingly for severe symptoms). 05/04/20  Yes Emeterio Reeve, DO  escitalopram (LEXAPRO) 5 MG tablet Take 1 tablet (5 mg total) by mouth at bedtime. 03/13/21  Yes Emeterio Reeve, DO  SUMAtriptan (IMITREX) 50 MG tablet Take 1 tablet (50 mg total) by mouth once. May repeat in 2 hours if headache persists or recurs. 08/13/17  08/13/17  Emeterio Reeve, DO    Review of Systems: negative except noted in HPI  Physical Exam Vitals: BP 126/77   Pulse 80   Resp 16   Ht 5' 5"  (1.651 m)   Wt 159 lb (72.1 kg)   LMP 02/22/2021   Breastfeeding No   BMI 26.46 kg/m  General: NAD HEENT: normocephalic, atraumatic Thyroid: no enlargement, no palpable nodules Pulmonary: Normal rate and effort, CTAB Cardiovascular: RRR Breast: Breast symmetrical, no tenderness, no palpable nodules or masses, no skin or nipple retraction present, no nipple discharge. No axillary or supraclavicular lymphadenopathy. Abdomen: soft,  non-tender, non-distended. No hepatomegaly, splenomegaly or masses palpable. No evidence of hernia  Genitourinary:  External: Normal external female genitalia. Normal urethral meatus  Vagina: Normal vaginal mucosa, no evidence of prolapse   Cervix: Grossly normal in appearance, no bleeding Extremities: no edema, erythema, or tenderness Neurologic: Grossly intact Psychiatric: mood appropriate, affect full  Female chaperone present for pelvic and breast portions of the physical exam  Assessment:  1. Well woman exam with routine gynecological exam   2. Family history of breast cancer    Plan: Papsmear today Follow up with GYN in 1 year or prn Follow up with PCP as planned Discussed the potential for pregnancy even though her husband takes testosterone as this is not a reliable form of contraception, she reports being fine if a pregnancy occurred. Based on having a first degree relative with breast cancer and without a known genetic syndrome she would fall into the moderate risk category for screening. The recommendation for screening would be the same as an average risk which would start at age 32. However, a small amt of data suggest that screening be initiated at an earlier age. She was given both options for screening (now for a baseline or start yearly at age 67) along with information for BRCA gene testing (per UTD). This was discussed with pt via MyChart message.   Julianne Handler, CNM 03/19/2021 1:53 PM

## 2021-03-21 LAB — CYTOLOGY - PAP
Comment: NEGATIVE
Diagnosis: NEGATIVE
High risk HPV: NEGATIVE

## 2021-05-15 ENCOUNTER — Encounter: Payer: Self-pay | Admitting: Osteopathic Medicine

## 2021-05-15 ENCOUNTER — Ambulatory Visit: Payer: BC Managed Care – PPO | Admitting: Osteopathic Medicine

## 2021-05-15 ENCOUNTER — Other Ambulatory Visit: Payer: Self-pay

## 2021-05-15 VITALS — BP 123/80 | HR 73 | Temp 98.0°F | Resp 20 | Ht 65.0 in | Wt 159.0 lb

## 2021-05-15 DIAGNOSIS — F411 Generalized anxiety disorder: Secondary | ICD-10-CM | POA: Diagnosis not present

## 2021-05-15 MED ORDER — ESCITALOPRAM OXALATE 5 MG PO TABS: 5.0000 mg | ORAL_TABLET | Freq: Every day | ORAL | 3 refills | Status: DC

## 2021-05-15 NOTE — Progress Notes (Signed)
Jody Gay is a 40 y.o. female who presents to  [facility] Primary Care & Sports Medicine at [facility]  today, 05/15/21, seeking care for the following:  Monitor on Lexapro - we started Lexapro 5 mg qhs about 2 months ago. Pt reports doing well on this. Still waking up with feeling some SOB but this resolves. Recent PFT was WNL. She exercises regularly without CP/SOB.    Depression screen [facility] 2/9 05/15/2021 02/27/2021 12/04/2020  Decreased Interest 0 1 1  Down, Depressed, Hopeless 0 1 1  PHQ - 2 Score 0 2 2  Altered sleeping 0 - 1  Tired, decreased energy 0 - 0  Change in appetite 0 - 1  Feeling bad or failure about yourself  0 - 0  Trouble concentrating 0 - 0  Moving slowly or fidgety/restless 0 - 0  Suicidal thoughts 0 - 0  PHQ-9 Score 0 - 4  Difficult doing work/chores Not difficult at all - Not difficult at all   GAD 7 : Generalized Anxiety Score 12/04/2020 06/01/2020 05/04/2020 07/02/2017  Nervous, Anxious, on Edge 2 0 3 3  Control/stop worrying 2 0 3 3  Worry too much - different things 1 0 1 3  Trouble relaxing 2 1 3 3   Restless 1 1 3 3   Easily annoyed or irritable 1 1 1 3   Afraid - awful might happen 2 0 3 3  Total GAD 7 Score 11 3 17 21   Anxiety Difficulty Not difficult at all Not difficult at all Somewhat difficult -         ASSESSMENT & PLAN with other pertinent findings:  The encounter diagnosis was Generalized anxiety disorder.   Stable on lexapro and doing well OK to stay on current dose Let me know if any changes / questions! Refills x1 year MyChart message to check in in 6 mos   There are no Patient Instructions on file for this visit.  No orders of the defined types were placed in this encounter.   Meds ordered this encounter  Medications   escitalopram (LEXAPRO) 5 MG tablet    Sig: Take 1 tablet (5 mg total) by mouth at bedtime.    Dispense:  90 tablet    Refill:  3      See below for relevant physical exam findings  See below for  recent lab and imaging results reviewed  Medications, allergies, PMH, PSH, SocH, FamH reviewed below    Follow-up instructions: Return for annual physical due 01/2022.                                        Exam:  BP 123/80 (BP Location: Left Arm, Patient Position: Sitting, Cuff Size: Normal)   Pulse 73   Temp 98 F (36.7 C) (Oral)   Resp 20   Ht 5\' 5"  (1.651 m)   Wt 159 lb (72.1 kg)   SpO2 98%   BMI 26.46 kg/m  Constitutional: VS see above. General Appearance: alert, well-developed, well-nourished, NAD Neck: No masses, trachea midline.  Respiratory: Normal respiratory effort. no wheeze, no rhonchi, no rales Cardiovascular: S1/S2 normal, no murmur, no rub/gallop auscultated. RRR.  Musculoskeletal: Gait normal. Symmetric and independent movement of all extremitie Neurological: Normal balance/coordination. No tremor. Skin: warm, dry, intact.  Psychiatric: Normal judgment/insight. Normal mood and affect. Oriented x3.   No outpatient medications have been marked as taking for the 05/15/21  encounter (Office Visit) with Sunnie Nielsen, DO.   Current Facility-Administered Medications for the 05/15/21 encounter (Office Visit) with Sunnie Nielsen, DO  Medication   albuterol (VENTOLIN HFA) 108 (90 Base) MCG/ACT inhaler 4 puff    Allergies  Allergen Reactions   Codeine    Gluten    Penicillins     Patient Active Problem List   Diagnosis Date Noted   Exercise-induced bronchoconstriction 12/04/2020   Numbness of toes 12/28/2019   Hypercalcemia 11/17/2019   Left shoulder pain 11/16/2019   Systolic murmur 11/16/2019   Left flank pain 11/16/2019   Ocular migraine 08/13/2017   Situational anxiety 07/03/2017   History of high cholesterol 07/03/2017   Generalized anxiety disorder 07/03/2017    Family History  Problem Relation Age of Onset   Skin cancer Mother    Breast cancer Mother 21       passed at age 11   Hypertension Father     Bipolar disorder Father    Bipolar disorder Sister    Colon cancer Paternal Grandmother    Skin cancer Maternal Grandmother     Social History   Tobacco Use  Smoking Status Never  Smokeless Tobacco Never    Past Surgical History:  Procedure Laterality Date   CESAREAN SECTION       There is no immunization history on file for this patient.  Recent Results (from the past 2160 hour(s))  CBC     Status: None   Collection Time: 02/27/21 12:00 AM  Result Value Ref Range   WBC 5.6 3.8 - 10.8 Thousand/uL   RBC 4.75 3.80 - 5.10 Million/uL   Hemoglobin 14.2 11.7 - 15.5 g/dL   HCT 70.1 77.9 - 39.0 %   MCV 88.6 80.0 - 100.0 fL   MCH 29.9 27.0 - 33.0 pg   MCHC 33.7 32.0 - 36.0 g/dL   RDW 30.0 92.3 - 30.0 %   Platelets 321 140 - 400 Thousand/uL   MPV 8.8 7.5 - 12.5 fL  COMPLETE METABOLIC PANEL WITH GFR     Status: None   Collection Time: 02/27/21 12:00 AM  Result Value Ref Range   Glucose, Bld 107 65 - 139 mg/dL    Comment: .        Non-fasting reference interval .    BUN 7 7 - 25 mg/dL   Creat 7.62 2.63 - 3.35 mg/dL   GFR, Est Non African American 100 > OR = 60 mL/min/1.26m2   GFR, Est African American 115 > OR = 60 mL/min/1.79m2   BUN/Creatinine Ratio NOT APPLICABLE 6 - 22 (calc)   Sodium 139 135 - 146 mmol/L   Potassium 4.0 3.5 - 5.3 mmol/L   Chloride 105 98 - 110 mmol/L   CO2 25 20 - 32 mmol/L   Calcium 9.5 8.6 - 10.2 mg/dL   Total Protein 7.7 6.1 - 8.1 g/dL   Albumin 4.4 3.6 - 5.1 g/dL   Globulin 3.3 1.9 - 3.7 g/dL (calc)   AG Ratio 1.3 1.0 - 2.5 (calc)   Total Bilirubin 0.3 0.2 - 1.2 mg/dL   Alkaline phosphatase (APISO) 66 31 - 125 U/L   AST 18 10 - 30 U/L   ALT 16 6 - 29 U/L  Lipid panel     Status: Abnormal   Collection Time: 02/27/21 12:00 AM  Result Value Ref Range   Cholesterol 210 (H) <200 mg/dL   HDL 51 > OR = 50 mg/dL   Triglycerides 456 <256 mg/dL   LDL Cholesterol (  Calc) 136 (H) mg/dL (calc)    Comment: Reference range: <100 . Desirable range  <100 mg/dL for primary prevention;   <70 mg/dL for patients with CHD or diabetic patients  with > or = 2 CHD risk factors. Marland Kitchen LDL-C is now calculated using the Martin-Hopkins  calculation, which is a validated novel method providing  better accuracy than the Friedewald equation in the  estimation of LDL-C.  Horald Pollen et al. Lenox Ahr. 1017;510(25): 2061-2068  (http://education.[facility].com/faq/FAQ164)    Total CHOL/HDL Ratio 4.1 <5.0 (calc)   Non-HDL Cholesterol (Calc) 159 (H) <130 mg/dL (calc)    Comment: For patients with diabetes plus 1 major ASCVD risk  factor, treating to a non-HDL-C goal of <100 mg/dL  (LDL-C of <85 mg/dL) is considered a therapeutic  option.   TSH     Status: None   Collection Time: 02/27/21 12:00 AM  Result Value Ref Range   TSH 2.36 mIU/L    Comment:           Reference Range .           > or = 20 Years  0.40-4.50 .                Pregnancy Ranges           First trimester    0.26-2.66           Second trimester   0.55-2.73           Third trimester    0.43-2.91   Cytology - PAP( [place])     Status: None   Collection Time: 03/19/21 11:16 AM  Result Value Ref Range   High risk HPV Negative    Adequacy      Satisfactory for evaluation; transformation zone component PRESENT.   Diagnosis      - Negative for intraepithelial lesion or malignancy (NILM)   Comment Normal Reference Range HPV - Negative     No results found.     All questions at time of visit were answered - patient instructed to contact office with any additional concerns or updates. ER/RTC precautions were reviewed with the patient as applicable.   Please note: manual typing as well as voice recognition software may have been used to produce this document - typos may escape review. Please contact Dr. Lyn Hollingshead for any needed clarifications.

## 2021-11-08 IMAGING — DX DG SHOULDER 2+V*L*
3 series · 3 of 3 positions shown · non-contrast
Comparison: None.

CLINICAL DATA: Left shoulder pain and discomfort.  No trauma.

EXAM:
LEFT SHOULDER - 2+ VIEW

[shoulder grashey]
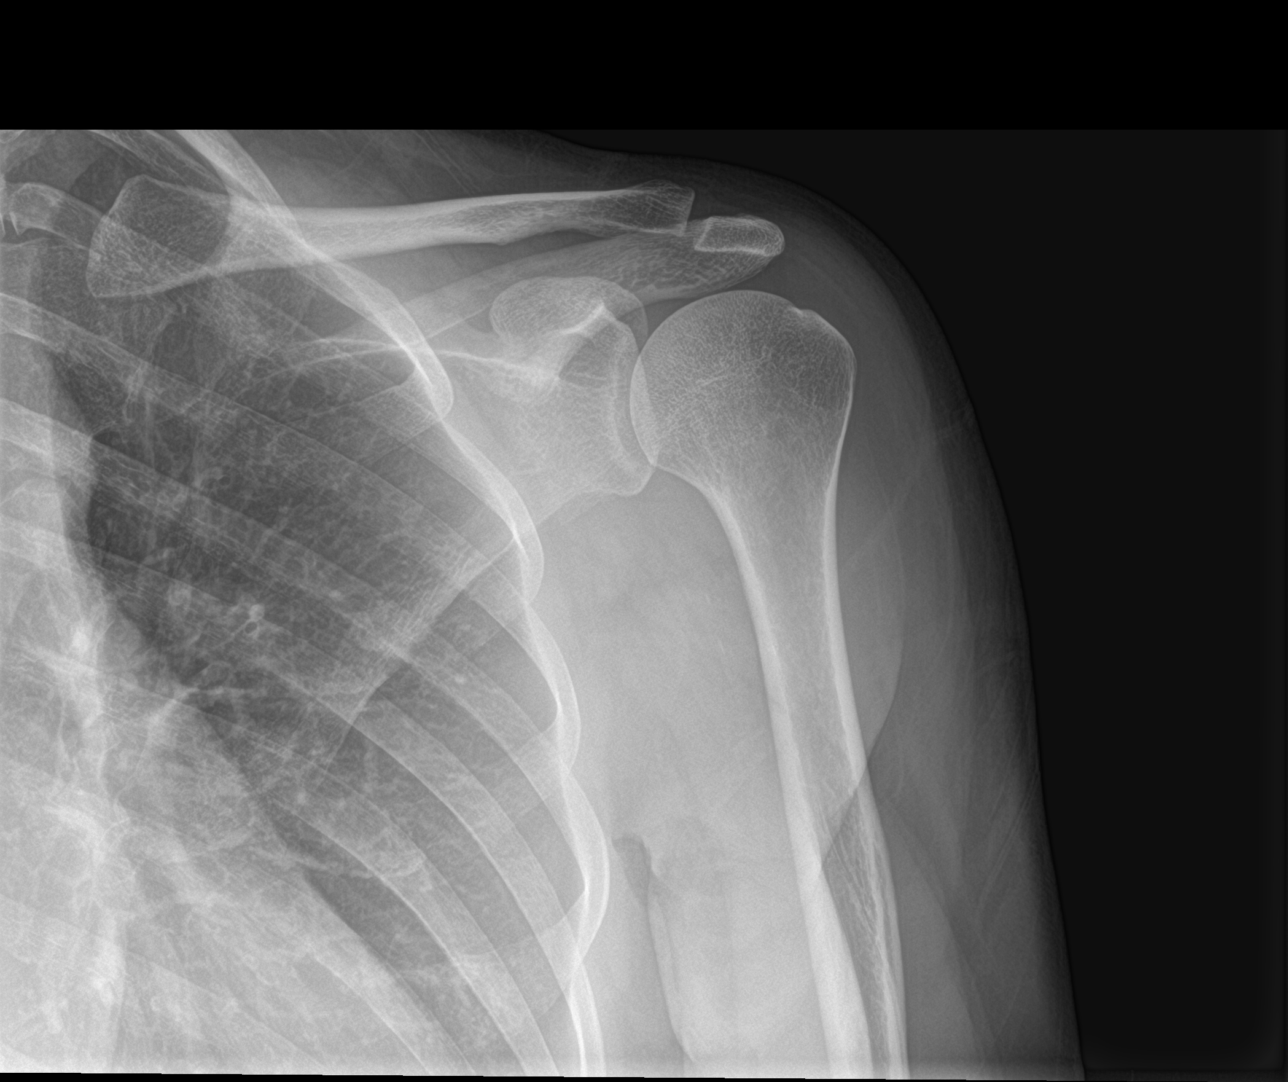

[shoulder y view]
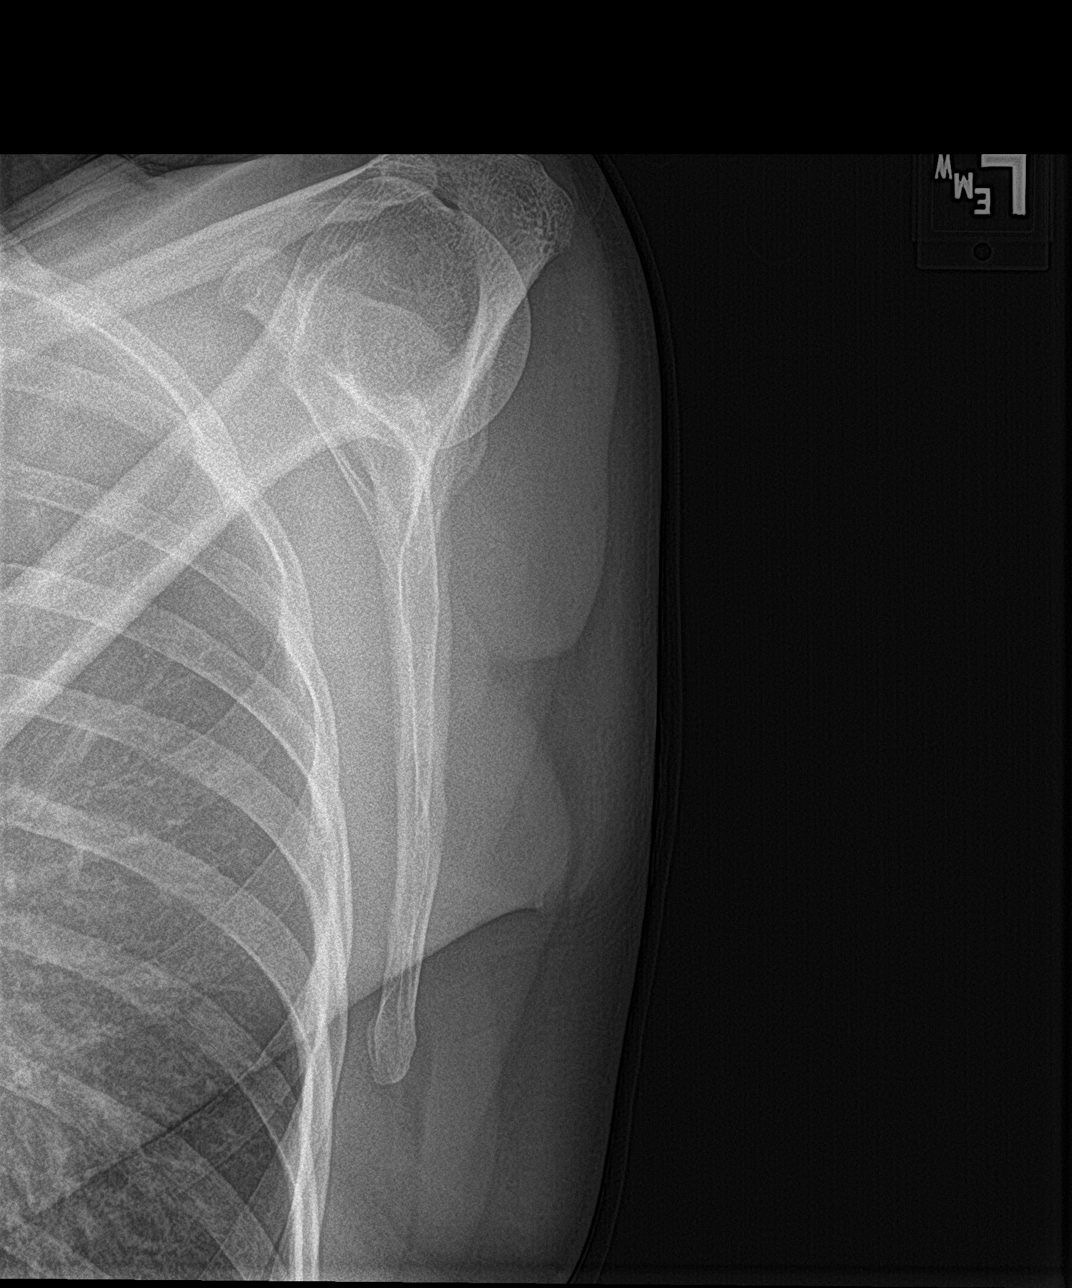

[shoulder axillary]
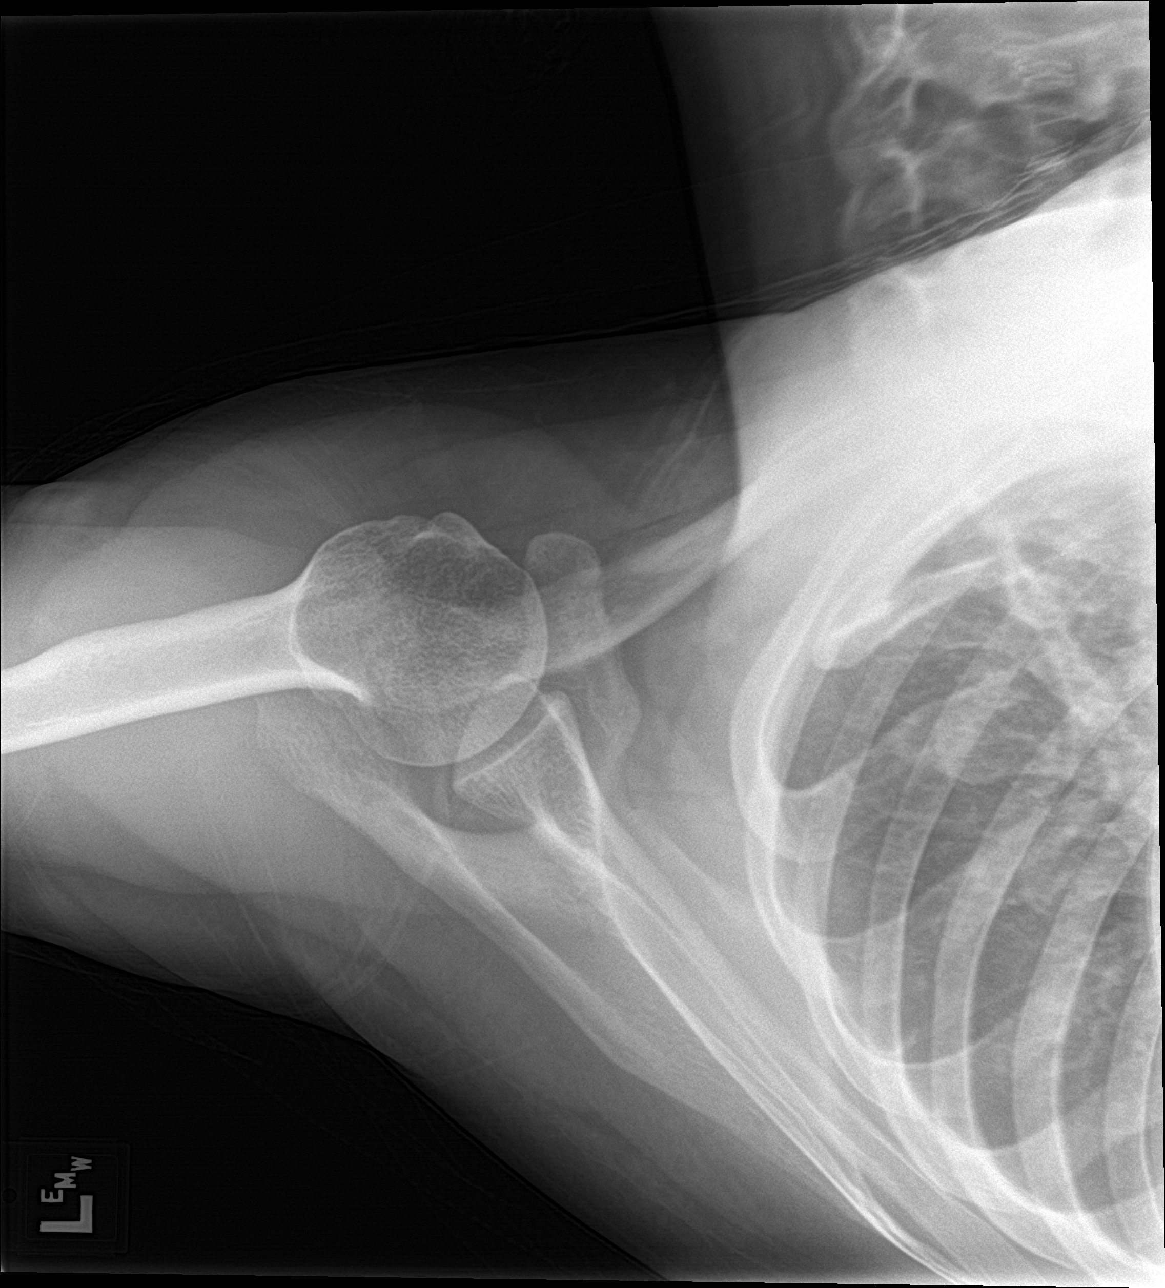

[3 of 3 positions shown; findings below may reference images not displayed]

FINDINGS: There is no evidence of fracture or dislocation. There is no
evidence of arthropathy or other focal bone abnormality. Soft
tissues are unremarkable.
IMPRESSION: Negative.

## 2021-11-08 IMAGING — DX DG RIBS W/ CHEST 3+V*L*
3 series · 3 of 3 positions shown · non-contrast
Comparison: None.

CLINICAL DATA: Pain following fall several months prior

EXAM:
LEFT RIBS AND CHEST - 3+ VIEW

[chest pa]
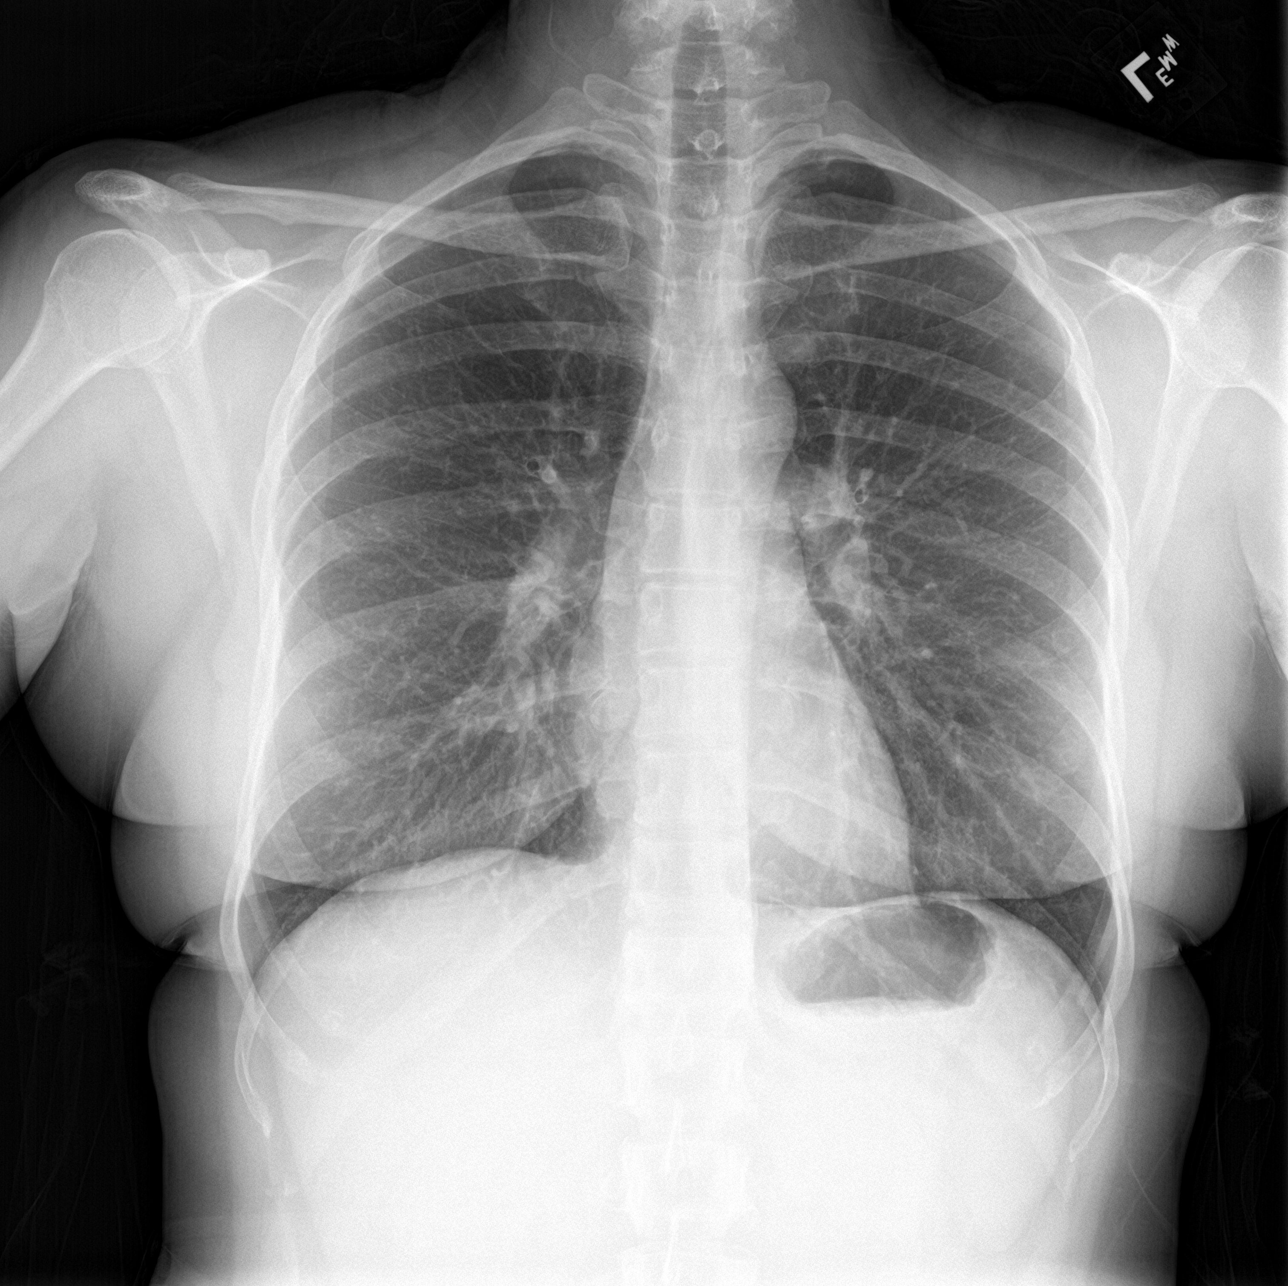

[rib pa]
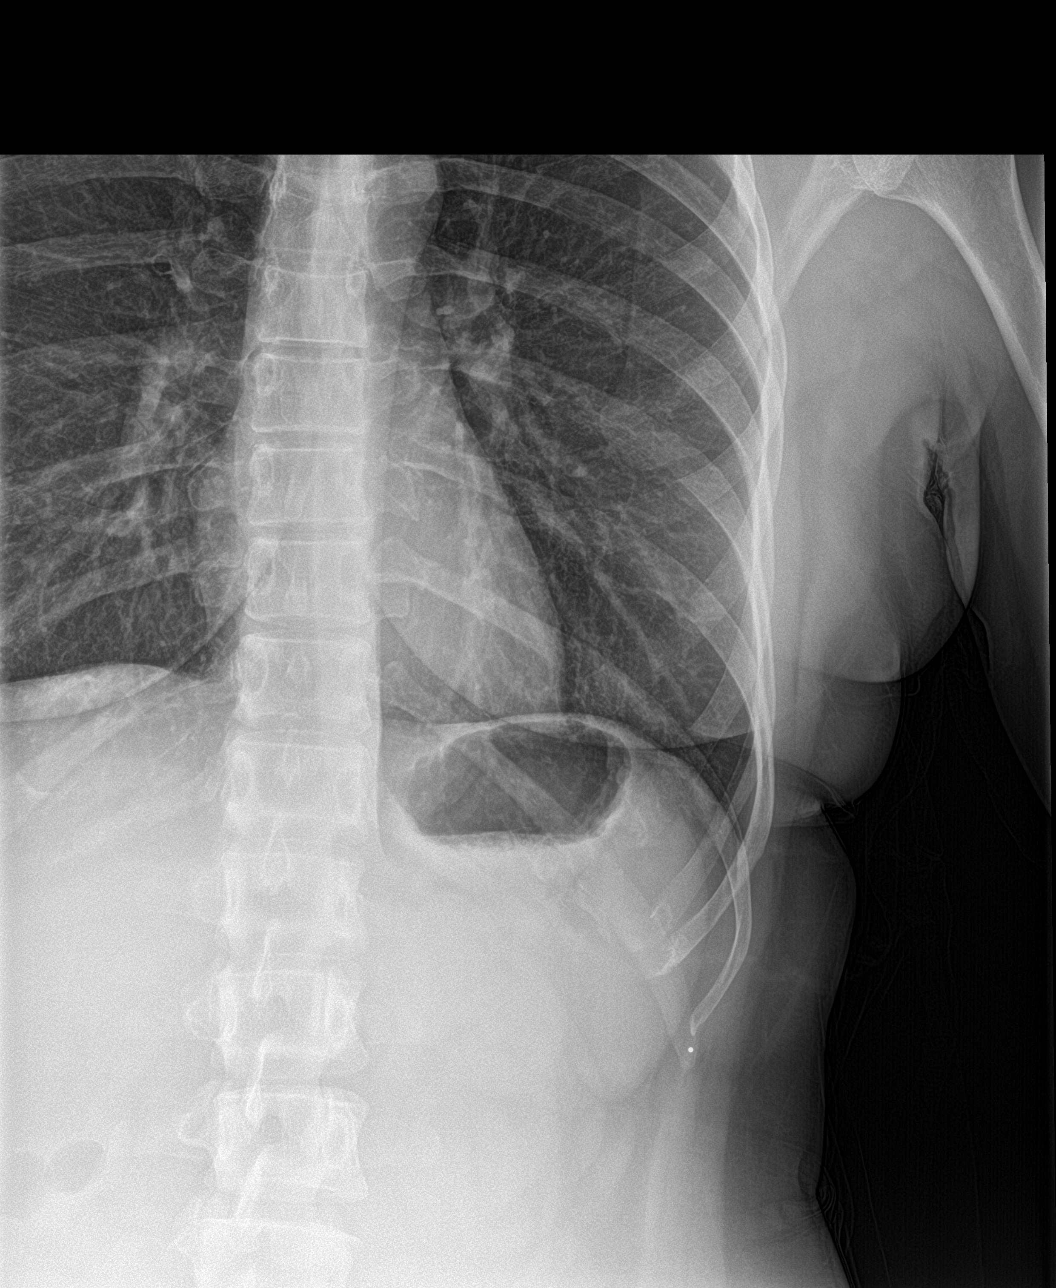

[rib pa obl]
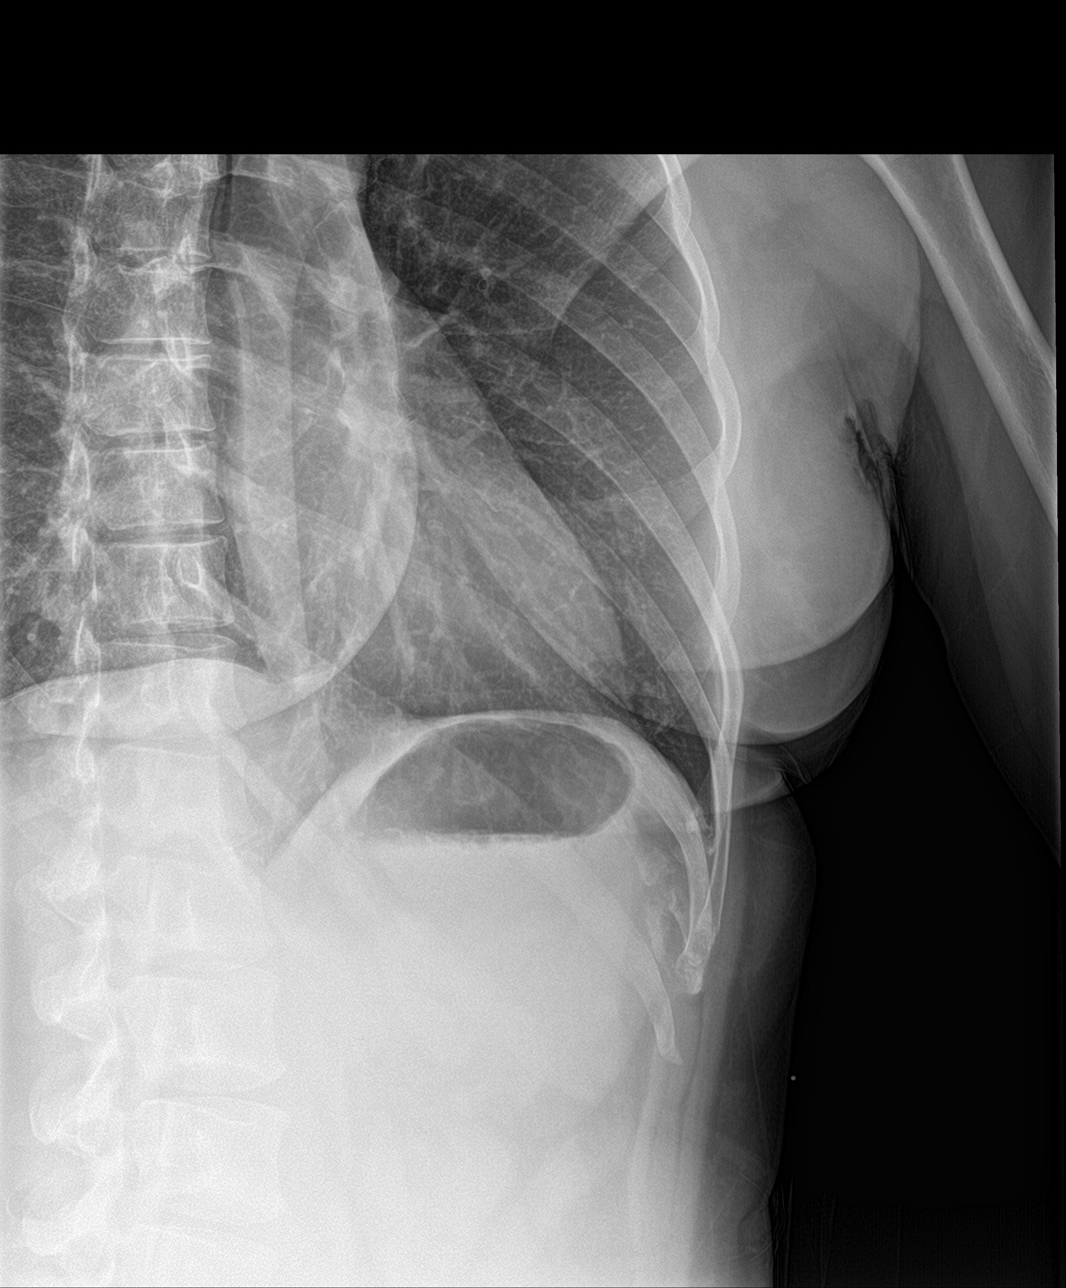

[3 of 3 positions shown; findings below may reference images not displayed]

FINDINGS: Frontal chest as well as oblique and cone-down rib images obtained.
Lungs are clear. Heart size and pulmonary vascularity are normal. No
adenopathy.

No evident pneumothorax or pleural effusion.  No rib fracture.
IMPRESSION: Lungs clear.  No evident rib fracture.

## 2022-03-25 ENCOUNTER — Ambulatory Visit (INDEPENDENT_AMBULATORY_CARE_PROVIDER_SITE_OTHER): Payer: No Typology Code available for payment source

## 2022-03-25 ENCOUNTER — Ambulatory Visit: Payer: No Typology Code available for payment source | Admitting: Sports Medicine

## 2022-03-25 DIAGNOSIS — M79671 Pain in right foot: Secondary | ICD-10-CM

## 2022-03-25 DIAGNOSIS — M79672 Pain in left foot: Secondary | ICD-10-CM | POA: Diagnosis not present

## 2022-03-25 DIAGNOSIS — M722 Plantar fascial fibromatosis: Secondary | ICD-10-CM

## 2022-03-25 MED ORDER — MELOXICAM 15 MG PO TABS: ORAL_TABLET | ORAL | 3 refills | Status: DC

## 2022-03-25 NOTE — Patient Instructions (Signed)
Look for a medium AirHeel brace made by [employer] on [employer]. ?

## 2022-03-25 NOTE — Progress Notes (Signed)
? ? ?  Procedures performed today:   ? ?None. ? ?Independent interpretation of notes and tests performed by another provider:  ? ?None. ? ?Brief History, Exam, Impression, and Recommendations:   ? 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 ?She switched to some minimalist zero-drop shoes, unfortunately started having increasing pain, particularly on the plantar heel, left worse than right. ?Worst in the mornings. ?On exam she has pes cavus, tenderness at the plantar fascial origin, minimally positive calcaneal squeeze. ?Suspect planter fasciitis, she will continue to use the [place], avoid running for now, walking is okay. ?Adding meloxicam, home conditioning exercises, she will get an air heel brace from [employer]. ?I would like to see her back in 4 weeks, if not better we will get him custom molded orthotics, if this does not work we will do an injection. ? ? ? ?___________________________________________ ?[name]. Benjamin Stain, M.D., ABFM., CAQSM. ?Primary Care and Sports Medicine ?[facility] ? ?Adjunct Instructor of Family Medicine  ?[employer] ?

## 2022-03-25 NOTE — Assessment & Plan Note (Signed)
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 ?She switched to some minimalist zero-drop shoes, unfortunately started having increasing pain, particularly on the plantar heel, left worse than right. ?Worst in the mornings. ?On exam she has pes cavus, tenderness at the plantar fascial origin, minimally positive calcaneal squeeze. ?Suspect planter fasciitis, she will continue to use the [place], avoid running for now, walking is okay. ?Adding meloxicam, home conditioning exercises, she will get an air heel brace from [employer]. ?I would like to see her back in 4 weeks, if not better we will get him custom molded orthotics, if this does not work we will do an injection. ?

## 2022-04-22 IMAGING — DX DG CHEST 2V
2 series · 2 of 2 positions shown · non-contrast
Comparison: Chest radiograph 11/16/2019

CLINICAL DATA: Patient c/o not being able to get a good deep breath
since [REDACTED]. No cold sx's, other than a cold sore, no cough, thinks
a possible virus.

EXAM:
CHEST - 2 VIEW

[chest pa]
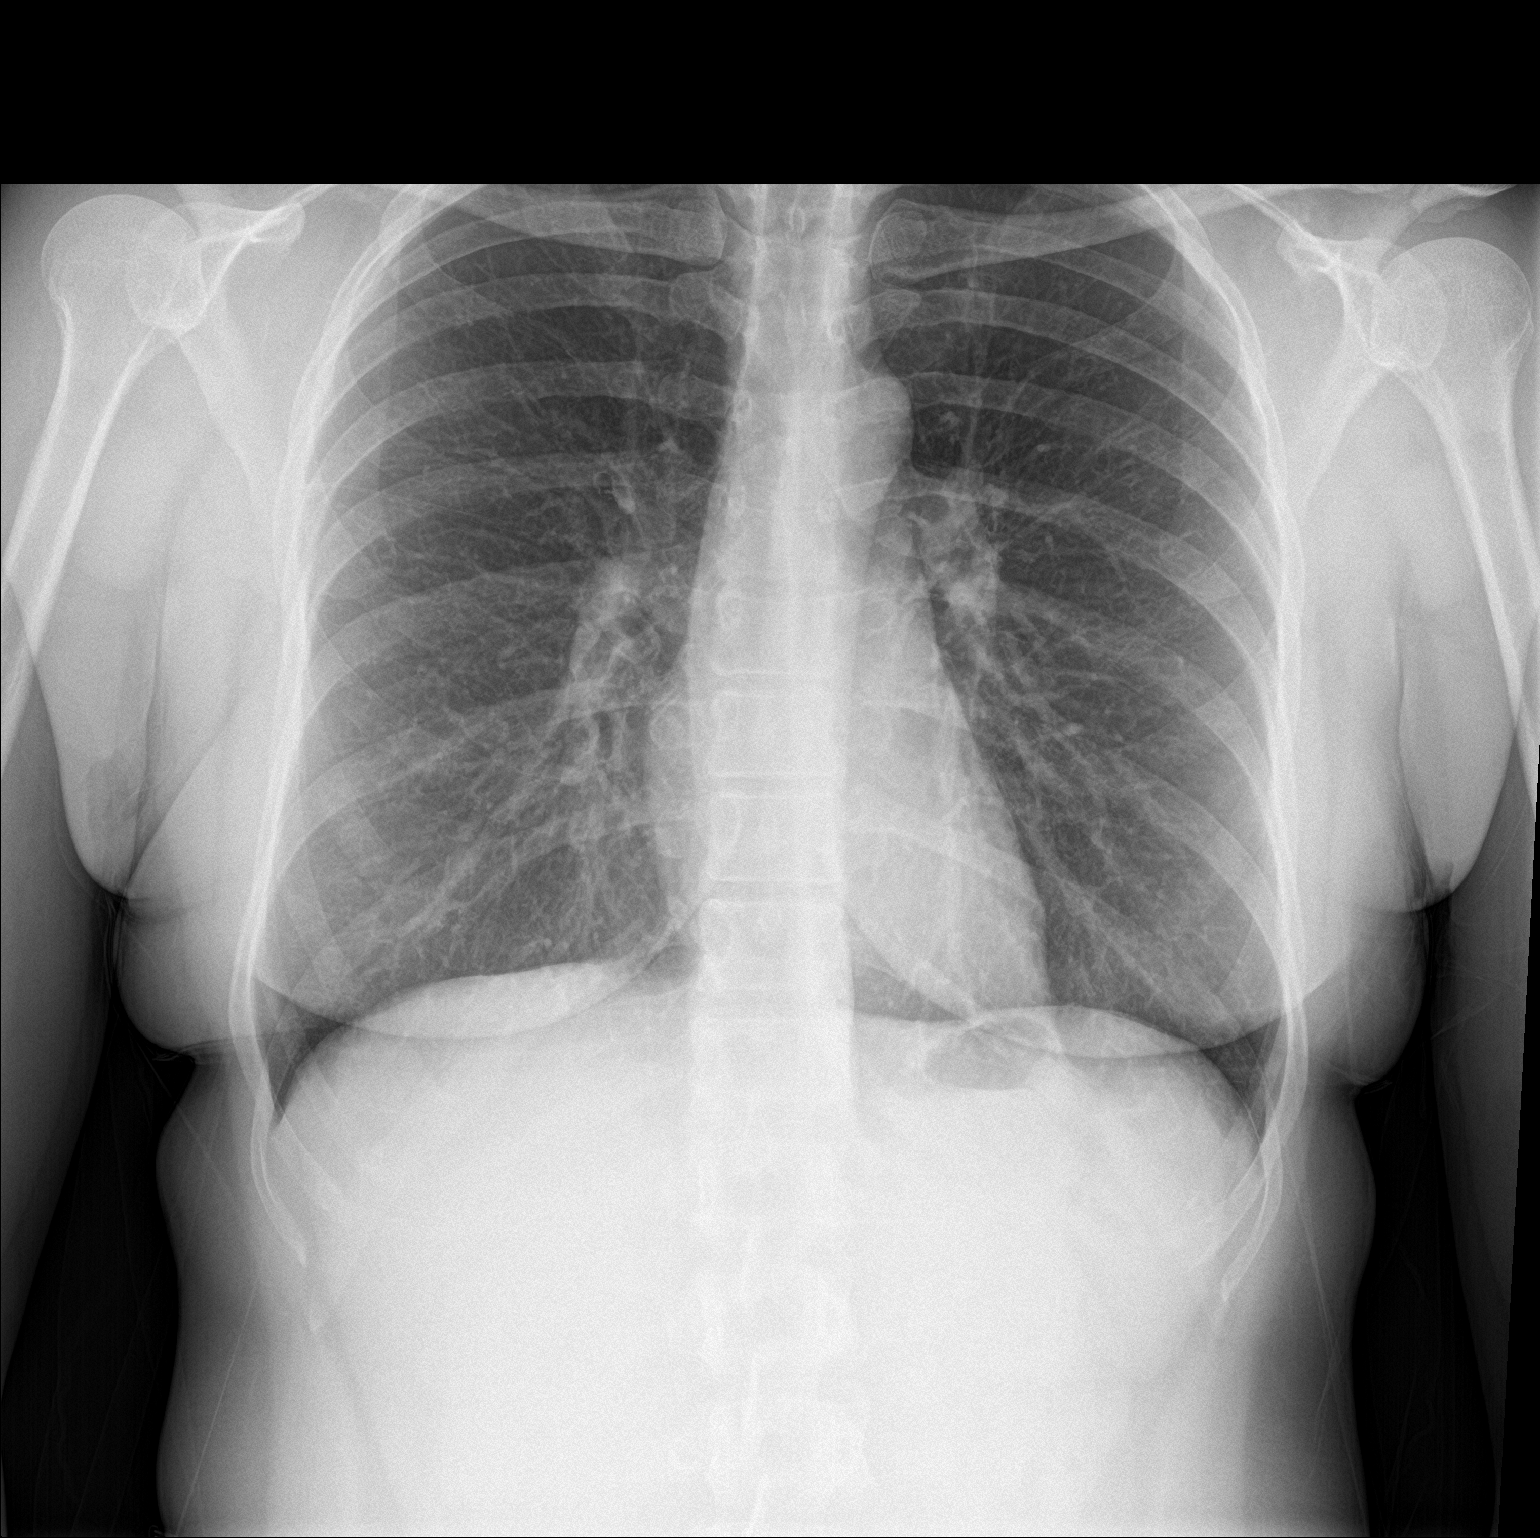

[chest lat]
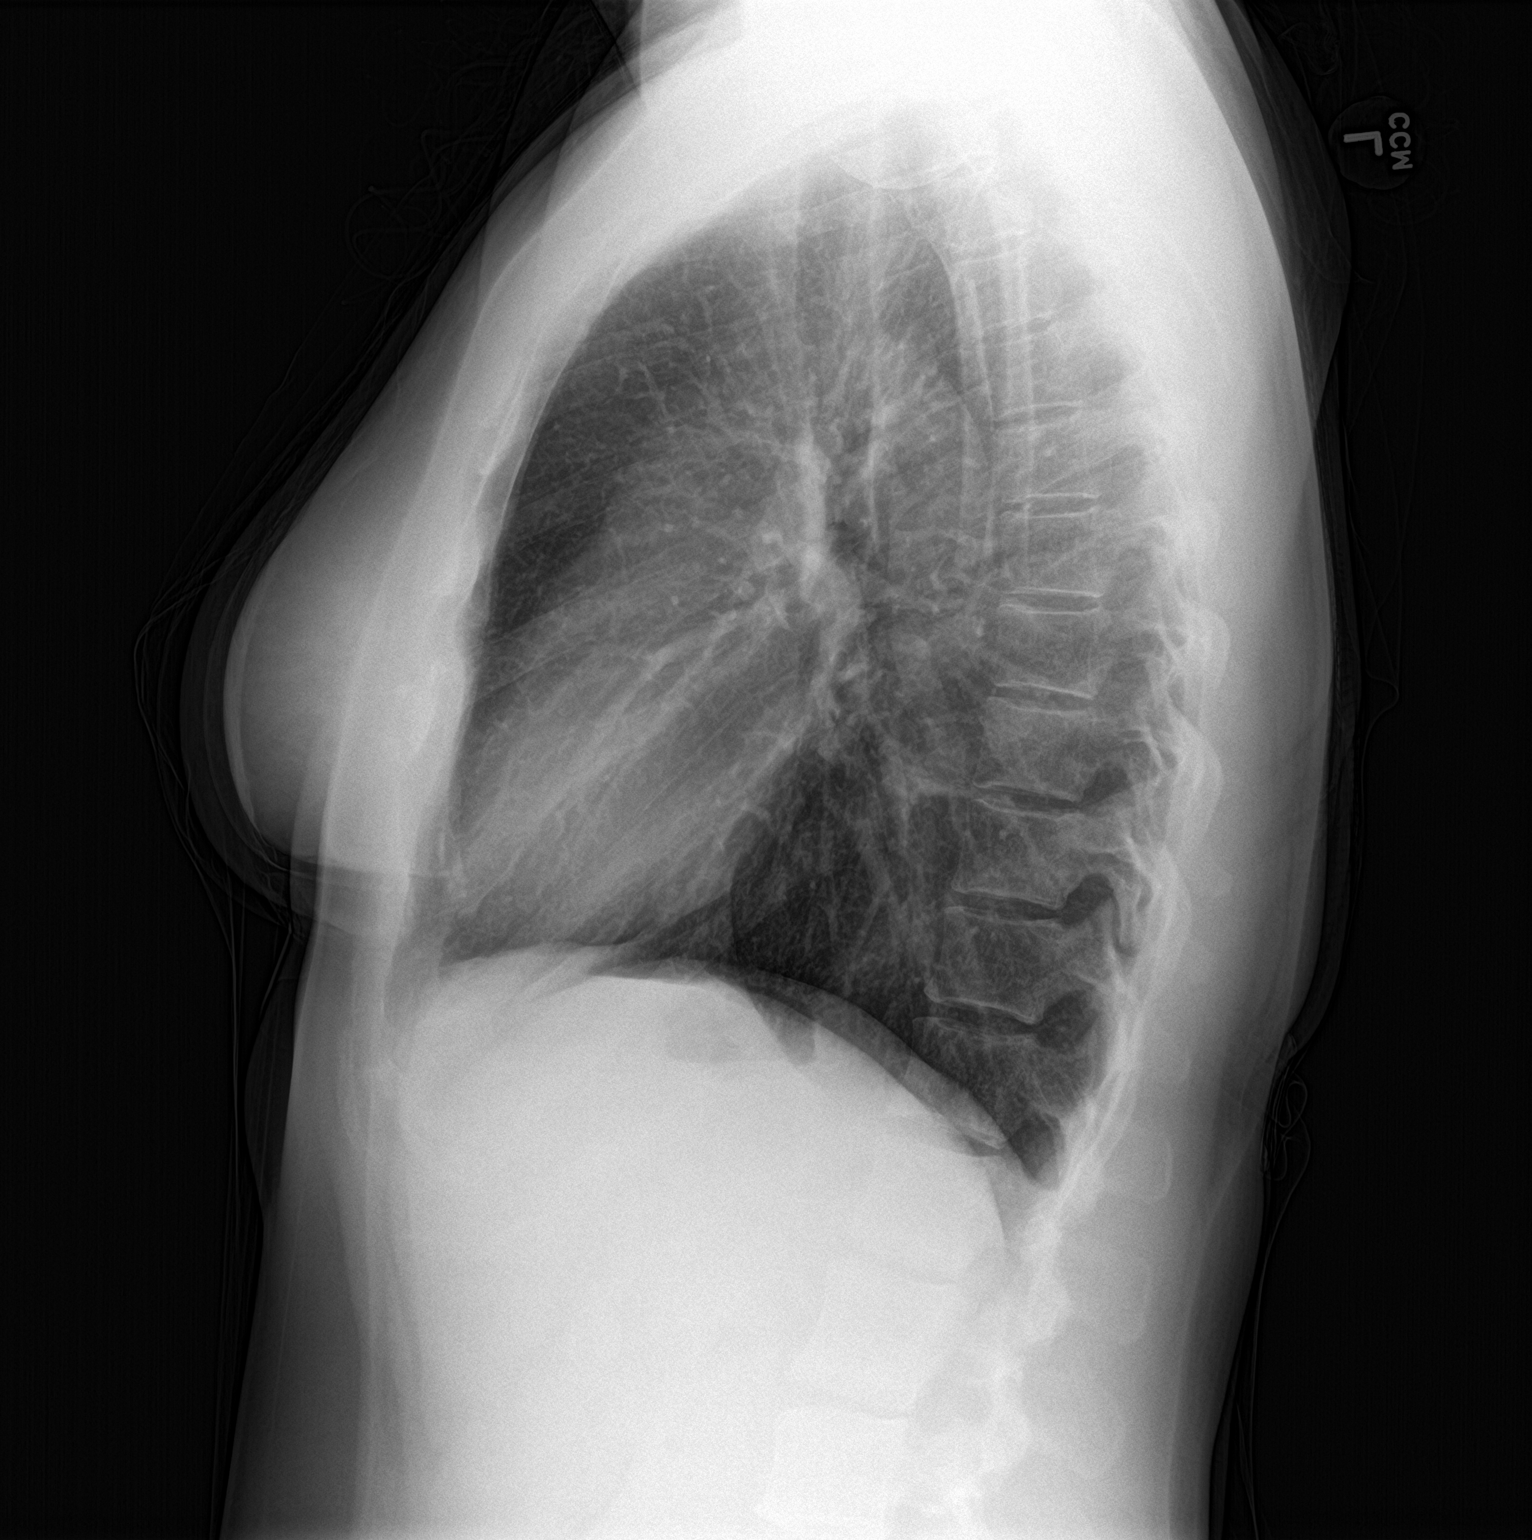

[2 of 2 positions shown; findings below may reference images not displayed]

FINDINGS: The heart size and mediastinal contours are within normal limits.
The lungs are clear. No pneumothorax or pleural effusion. The
visualized skeletal structures are unremarkable.
IMPRESSION: No evidence of active disease.

## 2022-04-29 ENCOUNTER — Ambulatory Visit: Payer: No Typology Code available for payment source | Admitting: Sports Medicine

## 2022-04-29 ENCOUNTER — Ambulatory Visit (INDEPENDENT_AMBULATORY_CARE_PROVIDER_SITE_OTHER): Payer: No Typology Code available for payment source

## 2022-04-29 DIAGNOSIS — M722 Plantar fascial fibromatosis: Secondary | ICD-10-CM

## 2022-04-29 NOTE — Progress Notes (Signed)
    Procedures performed today:    Procedure: Real-time Ultrasound Guided injection of the left plantar fascial origin Device: Samsung HS60  Verbal informed consent obtained.  Time-out conducted.  Noted no overlying erythema, induration, or other signs of local infection.  Skin prepped in a sterile fashion.  Local anesthesia: Topical Ethyl chloride.  With sterile technique and under real time ultrasound guidance: Noted thickened plantar fascia, 1 cc Kenalog 40, 1 cc lidocaine, 1 cc bupivacaine injected easily Completed without difficulty  Advised to call if fevers/chills, erythema, induration, drainage, or persistent bleeding.  Images permanently stored and available for review in PACS.  Impression: Technically successful ultrasound guided injection.  Independent interpretation of notes and tests performed by another provider:   None.  Brief History, Exam, Impression, and Recommendations:    Plantar fasciitis, bilateral Very pleasant 41 year old female, runner, typically runs with [place], was running on asphalt. She had recently switched to minimalist a 0 drop shoes, and started having increasing pain plantar heel left worse than right. Pes cavus, tenderness at the plantar fascial origin. Right side has improved considerably with meloxicam, home conditioning, ASO brace and cutting back on running. Left side continues to hurt significantly particularly plantar aspect of the heel and worse in the mornings. Due to severity of pain in the mornings she does prefer injection today to custom molded orthotics. Return to see me 6 weeks later.    ___________________________________________ [name]. Benjamin Stain, M.D., ABFM., CAQSM. Primary Care and Sports Medicine [facility]  Adjunct Instructor of Family Medicine  [employer]

## 2022-04-29 NOTE — Assessment & Plan Note (Signed)
Very pleasant 41 year old female, runner, typically runs with Jody Gay, was running [place]. She had recently switched to minimalist a 0 drop shoes, and started having increasing pain plantar heel left worse than right. Pes cavus, tenderness at the plantar fascial origin. Right side has improved considerably with meloxicam, home conditioning, ASO brace and cutting back on running. Left side continues to hurt significantly particularly plantar aspect of the heel and worse in the mornings. Due to severity of pain in the mornings she does prefer injection today to custom molded orthotics. Return to see me 6 weeks later.

## 2022-06-10 ENCOUNTER — Ambulatory Visit: Payer: No Typology Code available for payment source | Admitting: Sports Medicine

## 2022-06-10 DIAGNOSIS — M722 Plantar fascial fibromatosis: Secondary | ICD-10-CM | POA: Diagnosis not present

## 2022-06-10 NOTE — Assessment & Plan Note (Signed)
Very pleasant 41 year old female runner, typically runs on [place], she had switched to a minimalist 0 drop shoe, had some increasing pain left worse than right plantar heel. We did an injection on the left side considering severity of pain, she also changed her diet, and her left side has improved considerably. Unfortunately yesterday she was running, took a misstep and had a violent dorsiflexion of her right foot and has pain now right plantar fascia. She did feel a pop, on exam she has tenderness plantar fascial origin without bruising. I am able to palpate the plantar fascia. I think we should treat this conservatively, icing, boot, crutches, she will call me if she needs anything more the meloxicam and return to see me in about 2 weeks with consideration of right-sided injection if not better.

## 2022-06-10 NOTE — Progress Notes (Signed)
    Procedures performed today:    None.  Independent interpretation of notes and tests performed by another provider:   None.  Brief History, Exam, Impression, and Recommendations:    Plantar fasciitis, bilateral Very pleasant 41 year old female runner, typically runs on [place], she had switched to a minimalist 0 drop shoe, had some increasing pain left worse than right plantar heel. We did an injection on the left side considering severity of pain, she also changed her diet, and her left side has improved considerably. Unfortunately yesterday she was running, took a misstep and had a violent dorsiflexion of her right foot and has pain now right plantar fascia. She did feel a pop, on exam she has tenderness plantar fascial origin without bruising. I am able to palpate the plantar fascia. I think we should treat this conservatively, icing, boot, crutches, she will call me if she needs anything more the meloxicam and return to see me in about 2 weeks with consideration of right-sided injection if not better.    ____________________________________________ [name]. Benjamin Stain, M.D., ABFM., CAQSM., AME. Primary Care and Sports Medicine [facility]  Adjunct Professor of Family Medicine  [employer]  Restaurant manager, fast food

## 2022-06-24 ENCOUNTER — Ambulatory Visit: Payer: No Typology Code available for payment source | Admitting: Sports Medicine

## 2022-06-24 DIAGNOSIS — M722 Plantar fascial fibromatosis: Secondary | ICD-10-CM

## 2022-06-24 MED ORDER — DICLOFENAC SODIUM 75 MG PO TBEC: 75.0000 mg | DELAYED_RELEASE_TABLET | Freq: Two times a day (BID) | ORAL | 3 refills | Status: DC

## 2022-06-24 NOTE — Progress Notes (Signed)
    Procedures performed today:    None.  Independent interpretation of notes and tests performed by another provider:   None.  Brief History, Exam, Impression, and Recommendations:    Plantar fasciitis, bilateral Jody Gay returns, she is a very pleasant 41 year old female, she typically runs in [place] but she had switched to minimalist 0 drop shoes and had increasing pain left worse than right, on the left side we did an injection sometime ago, left side continues to do well. Approximately 2 weeks ago she was walking, took a misstep and had a violent dorsiflexion of the right foot and had some pain right plantar fascia. Exam was benign with the exception of pain at the plantar fascial origin, we treated conservatively for a couple of weeks, icing, boot, crutches, she is now back into regular shoes but wearing supportive shoes through the day. She is continuing to improve so we will hold off on injection, meloxicam losing efficacy so we will switch to Voltaren. Return to see me as needed.  Chronic process not at goal with pharmacologic intervention  ____________________________________________ [name]. Benjamin Stain, M.D., ABFM., CAQSM., AME. Primary Care and Sports Medicine [facility]  Adjunct Professor of Family Medicine  [employer]  Restaurant manager, fast food

## 2022-06-24 NOTE — Assessment & Plan Note (Signed)
Jody Gay returns, she is a very pleasant 41 year old female, she typically runs in [place] but she had switched to minimalist 0 drop shoes and had increasing pain left worse than right, on the left side we did an injection sometime ago, left side continues to do well. Approximately 2 weeks ago she was walking, took a misstep and had a violent dorsiflexion of the right foot and had some pain right plantar fascia. Exam was benign with the exception of pain at the plantar fascial origin, we treated conservatively for a couple of weeks, icing, boot, crutches, she is now back into regular shoes but wearing supportive shoes through the day. She is continuing to improve so we will hold off on injection, meloxicam losing efficacy so we will switch to Voltaren. Return to see me as needed.

## 2022-07-19 ENCOUNTER — Ambulatory Visit
Admission: RE | Admit: 2022-07-19 | Discharge: 2022-07-19 | Disposition: A | Discharge status: Home / Self Care | Payer: No Typology Code available for payment source | Source: Ambulatory Visit | Attending: Family Medicine | Admitting: Family Medicine

## 2022-07-19 ENCOUNTER — Ambulatory Visit (INDEPENDENT_AMBULATORY_CARE_PROVIDER_SITE_OTHER): Payer: No Typology Code available for payment source

## 2022-07-19 VITALS — BP 136/87 | HR 78 | Temp 99.5°F | Resp 14 | Ht 65.0 in | Wt 175.0 lb

## 2022-07-19 DIAGNOSIS — S62650A Nondisplaced fracture of medial phalanx of right index finger, initial encounter for closed fracture: Secondary | ICD-10-CM | POA: Diagnosis not present

## 2022-07-19 DIAGNOSIS — T7840XA Allergy, unspecified, initial encounter: Secondary | ICD-10-CM

## 2022-07-19 DIAGNOSIS — M79644 Pain in right finger(s): Secondary | ICD-10-CM

## 2022-07-19 DIAGNOSIS — S50861A Insect bite (nonvenomous) of right forearm, initial encounter: Secondary | ICD-10-CM

## 2022-07-19 NOTE — ED Provider Notes (Signed)
[facility]    CSN: [id number] Arrival date & time: 07/19/22  1248      History   Chief Complaint Chief Complaint  Patient presents with   Insect Bite   Finger Injury    Right index    HPI Jody Gay is a 41 y.o. female.   HPI  Patient was doing gymnastics with her daughter.  She did a handstand.  She has been doing these almost daily.  Yesterday when she did a handstand she had a audible pop and sudden pain in her right index finger.  It swollen around to the PIP.  She has pain with flexion.  Patient also was bitten by an insect yesterday on her right forearm.  There is a large red itchy rash that is spreading.  She has never had this reaction after an insect bite before.  No wheezing  Past Medical History:  Diagnosis Date   Anxiety    Gestational age less than 24 weeks    14weeks   Hyperlipidemia     Patient Active Problem List   Diagnosis Date Noted   Plantar fasciitis, bilateral 03/25/2022   Exercise-induced bronchoconstriction 12/04/2020   Numbness of toes 12/28/2019   Hypercalcemia 11/17/2019   Left shoulder pain 11/16/2019   Systolic murmur 11/16/2019   Left flank pain 11/16/2019   Ocular migraine 08/13/2017   Situational anxiety 07/03/2017   History of high cholesterol 07/03/2017   Generalized anxiety disorder 07/03/2017    Past Surgical History:  Procedure Laterality Date   CESAREAN SECTION      OB History     Gravida  3   Para  3   Term  2   Preterm  1   AB      Living         SAB      IAB      Ectopic      Multiple      Live Births  3            Home Medications    Prior to Admission medications   Medication Sig Start Date End Date Taking? Authorizing Provider  diclofenac (VOLTAREN) 75 MG EC tablet Take 1 tablet (75 mg total) by mouth 2 (two) times daily. 06/24/22 06/24/23 Yes Monica Becton, MD  albuterol (VENTOLIN HFA) 108 (90 Base) MCG/ACT inhaler 2 puffs 15 minutes before physical activity,  may also use for episodes of mild shortness of breath. 12/04/20   Monica Becton, MD  cetirizine (ZYRTEC) 10 MG tablet Take 10 mg by mouth daily.    [provider]  clonazePAM (KLONOPIN) 0.5 MG tablet Take 0.5-1 tablets (0.25-0.5 mg total) by mouth 2 (two) times daily as needed for anxiety (use sparingly for severe symptoms). 05/04/20   Sunnie Nielsen, DO  escitalopram (LEXAPRO) 5 MG tablet Take 1 tablet (5 mg total) by mouth at bedtime. 05/15/21   Sunnie Nielsen, DO  SUMAtriptan (IMITREX) 50 MG tablet Take 1 tablet (50 mg total) by mouth once. May repeat in 2 hours if headache persists or recurs. Patient not taking: Reported on 07/19/2022 08/13/17 08/13/17  Sunnie Nielsen, DO    Family History Family History  Problem Relation Age of Onset   Skin cancer Mother    Breast cancer Mother 70       passed at age 87   Hypertension Father    Bipolar disorder Father    Bipolar disorder Sister    Skin cancer Maternal Grandmother  Colon cancer Paternal Grandmother     Social History Social History   Tobacco Use   Smoking status: Never   Smokeless tobacco: Never  Vaping Use   Vaping Use: Never used  Substance Use Topics   Alcohol use: Yes   Drug use: No     Allergies   Codeine, Gluten, Nickel, and Penicillins   Review of Systems Review of Systems See HPI  Physical Exam Triage Vital Signs ED Triage Vitals  Enc Vitals Group     BP 07/19/22 1356 136/87     Pulse Rate 07/19/22 1356 78     Resp 07/19/22 1356 14     Temp 07/19/22 1356 99.5 F (37.5 C)     Temp Source 07/19/22 1356 Oral     SpO2 07/19/22 1356 98 %     Weight 07/19/22 1358 175 lb (79.4 kg)     Height 07/19/22 1358 5\' 5"  (1.651 m)     Head Circumference --      Peak Flow --      Pain Score 07/19/22 1357 6     Pain Loc --      Pain Edu? --      Excl. in GC? --    No data found.  Updated Vital Signs BP 136/87 (BP Location: Left Arm)   Pulse 78   Temp 99.5 F (37.5 C) (Oral)    Resp 14   Ht 5\' 5"  (1.651 m)   Wt 79.4 kg   LMP 06/25/2022 (Approximate)   SpO2 98%   BMI 29.12 kg/m       Physical Exam Constitutional:      General: She is not in acute distress.    Appearance: Normal appearance. She is well-developed and normal weight.  HENT:     Head: Normocephalic and atraumatic.  Eyes:     Conjunctiva/sclera: Conjunctivae normal.     Pupils: Pupils are equal, round, and reactive to light.  Cardiovascular:     Rate and Rhythm: Normal rate.  Pulmonary:     Effort: Pulmonary effort is normal. No respiratory distress.     Breath sounds: Normal breath sounds. No wheezing.  Abdominal:     General: There is no distension.     Palpations: Abdomen is soft.  Musculoskeletal:        General: Normal range of motion.       Arms:     Cervical back: Normal range of motion.     Comments: Right index finger has swelling around the DIP and limited flexion.  Some bruising noted on the lateral aspect  Skin:    General: Skin is warm and dry.  Neurological:     Mental Status: She is alert.      UC Treatments / Results  Labs (all labs ordered are listed, but only abnormal results are displayed) Labs Reviewed - No data to display  EKG   Radiology DG Hand Complete Right  Addendum Date: 07/19/2022   ADDENDUM REPORT: 07/19/2022 14:30 ADDENDUM: Case was reviewed with the emergency room physician. Patient hurts directly at the index finger PIP joint. On the lateral view, there is a lucency that crosses the palmar base of the middle phalanx, which likely intersects the articular surface. This is consistent with a nondisplaced fracture. Electronically Signed   By: 07/21/2022 M.D.   On: 07/19/2022 14:30   Result Date: 07/19/2022 CLINICAL DATA:  Right index finger pain after doing a handstand 2 days ago. EXAM: RIGHT HAND - COMPLETE 3+  VIEW COMPARISON:  None Available. FINDINGS: There is no evidence of fracture or dislocation. There is no evidence of arthropathy or other  focal bone abnormality. Soft tissues are unremarkable. IMPRESSION: Negative. Electronically Signed: By: Amie Portland M.D. On: 07/19/2022 14:18    Procedures Procedures (including critical care time)  Medications Ordered in UC Medications - No data to display  Initial Impression / Assessment and Plan / UC Course  I have reviewed the triage vital signs and the nursing notes.  Pertinent labs & imaging results that were available during my care of the patient were reviewed by me and considered in my medical decision making (see chart for details).     I called radiology to clarify findings on the x-ray. Final Clinical Impressions(s) / UC Diagnoses   Final diagnoses:  Closed nondisplaced fracture of middle phalanx of right index finger, initial encounter  Insect bite of right forearm, initial encounter  Allergic reaction, initial encounter     Discharge Instructions      Use ice and antihistamines to reduce the bite reaction on your right forearm May take Claritin/Zyrtec in the daytime and Benadryl at night May take ibuprofen, Aleve, or Tylenol as needed for your finger pain Keep splinted except for bathing See Dr. Karie Schwalbe in follow-up     ED Prescriptions   None    PDMP not reviewed this encounter.   Eustace Moore, MD 07/19/22 [phone]

## 2022-07-19 NOTE — Discharge Instructions (Signed)
Use ice and antihistamines to reduce the bite reaction on your right forearm May take Claritin/Zyrtec in the daytime and Benadryl at night May take ibuprofen, Aleve, or Tylenol as needed for your finger pain Keep splinted except for bathing See Dr. Karie Schwalbe in follow-up

## 2022-07-19 NOTE — ED Triage Notes (Signed)
Insect bite to R FA yesterday - warm & itchy per pt  Pt believes it may be an ant bite  No relief with itch spray  Pain to right index finger since Thursday after doing a handstand  Pain extends to right wrist  after pt felt a popping sensation to the finger - wearing a splint - limited ROM Taking Voltaren for plantar fascitis

## 2022-08-07 ENCOUNTER — Other Ambulatory Visit: Payer: Self-pay | Admitting: Osteopathic Medicine

## 2022-08-12 ENCOUNTER — Ambulatory Visit (INDEPENDENT_AMBULATORY_CARE_PROVIDER_SITE_OTHER): Payer: No Typology Code available for payment source

## 2022-08-12 ENCOUNTER — Ambulatory Visit: Payer: No Typology Code available for payment source | Admitting: Sports Medicine

## 2022-08-12 DIAGNOSIS — S62650A Nondisplaced fracture of medial phalanx of right index finger, initial encounter for closed fracture: Secondary | ICD-10-CM | POA: Diagnosis not present

## 2022-08-12 DIAGNOSIS — S62620A Displaced fracture of medial phalanx of right index finger, initial encounter for closed fracture: Secondary | ICD-10-CM | POA: Insufficient documentation

## 2022-08-12 NOTE — Assessment & Plan Note (Signed)
Pleasant 41 year old female, she is 3 weeks post right index finger injury, x-rays in urgent care did show a fracture through the volar base of the middle phalanx. She has really not been doing any form of immobilization or treatment since then though immobilization was recommended. On exam she does have swelling at the PIP, she does have approximately 10 or 15 degrees of flexion lag at the PIP. No sign of a rotational deformity or developing swan-neck deformity Otherwise good motion in the other joints, good strength in all directions. We will get updated x-rays, I would like her to keep the second and third digits buddy tape, and return to see me in about 3 weeks.

## 2022-08-12 NOTE — Progress Notes (Signed)
    Procedures performed today:    None.  Independent interpretation of notes and tests performed by another provider:   None.  Brief History, Exam, Impression, and Recommendations:    Fracture of middle phalanx of right index finger Pleasant 41 year old female, she is 3 weeks post right index finger injury, x-rays in urgent care did show a fracture through the volar base of the middle phalanx. She has really not been doing any form of immobilization or treatment since then though immobilization was recommended. On exam she does have swelling at the PIP, she does have approximately 10 or 15 degrees of flexion lag at the PIP. No sign of a rotational deformity or developing swan-neck deformity Otherwise good motion in the other joints, good strength in all directions. We will get updated x-rays, I would like her to keep the second and third digits buddy tape, and return to see me in about 3 weeks.    ____________________________________________ [name]. Benjamin Stain, M.D., ABFM., CAQSM., AME. Primary Care and Sports Medicine [facility]  Adjunct Professor of Family Medicine  [place] [employer] [facility] [employer]  Restaurant manager, fast food

## 2022-09-05 ENCOUNTER — Ambulatory Visit: Payer: No Typology Code available for payment source | Admitting: Sports Medicine

## 2022-09-05 ENCOUNTER — Other Ambulatory Visit: Payer: Self-pay | Admitting: Family Medicine

## 2022-09-05 ENCOUNTER — Encounter: Payer: Self-pay | Admitting: Sports Medicine

## 2022-09-05 DIAGNOSIS — S62650D Nondisplaced fracture of medial phalanx of right index finger, subsequent encounter for fracture with routine healing: Secondary | ICD-10-CM

## 2022-09-05 NOTE — Assessment & Plan Note (Signed)
Jody Gay returns, she is a very pleasant 41 year old female, she is now about 6 weeks post right index finger injury, she had a fracture to the volar base of the middle phalanx, at the last visit she had about 10 to 15 degrees of flexion lag at the PIP. Today she continues to improve, she has continued buddy taping. She has minor swelling at the PIP, I am able to passively flex it to almost 90 degrees. I think at this juncture we need to get her finger moving, adding home physical therapy, continue Voltaren twice a day for the next month, followed by once a day for a month after that and then she can stop. Return to see me in maybe 6 weeks but only if things are not progressing. She does understand that swelling and slight discomfort can persist for 3 months with fractures like this.

## 2022-09-05 NOTE — Progress Notes (Signed)
    Procedures performed today:    None.  Independent interpretation of notes and tests performed by another provider:   None.  Brief History, Exam, Impression, and Recommendations:    Fracture of middle phalanx of right index finger Jody Gay returns, she is a very pleasant 41 year old female, she is now about 6 weeks post right index finger injury, she had a fracture to the volar base of the middle phalanx, at the last visit she had about 10 to 15 degrees of flexion lag at the PIP. Today she continues to improve, she has continued buddy taping. She has minor swelling at the PIP, I am able to passively flex it to almost 90 degrees. I think at this juncture we need to get her finger moving, adding home physical therapy, continue Voltaren twice a day for the next month, followed by once a day for a month after that and then she can stop. Return to see me in maybe 6 weeks but only if things are not progressing. She does understand that swelling and slight discomfort can persist for 3 months with fractures like this.    ____________________________________________ Gwen Her. Dianah Field, M.D., ABFM., CAQSM., AME. Primary Care and Sports Medicine [facility]  Adjunct Professor of [place] [employer] [facility] [employer]  Risk manager

## 2022-09-05 NOTE — Telephone Encounter (Signed)
Patient is scheduled 09/16/22

## 2022-09-05 NOTE — Telephone Encounter (Signed)
Pt needs to establish with a PCP

## 2022-09-09 ENCOUNTER — Other Ambulatory Visit: Payer: Self-pay | Admitting: Family Medicine

## 2022-09-16 ENCOUNTER — Encounter: Payer: Self-pay | Admitting: Medical-Surgical

## 2022-09-16 ENCOUNTER — Ambulatory Visit: Payer: No Typology Code available for payment source | Admitting: Medical-Surgical

## 2022-09-16 VITALS — BP 131/89 | HR 81 | Ht 65.0 in | Wt 176.0 lb

## 2022-09-16 DIAGNOSIS — J45990 Exercise induced bronchospasm: Secondary | ICD-10-CM

## 2022-09-16 DIAGNOSIS — F411 Generalized anxiety disorder: Secondary | ICD-10-CM

## 2022-09-16 DIAGNOSIS — M722 Plantar fascial fibromatosis: Secondary | ICD-10-CM

## 2022-09-16 DIAGNOSIS — Z7689 Persons encountering health services in other specified circumstances: Secondary | ICD-10-CM | POA: Diagnosis not present

## 2022-09-16 DIAGNOSIS — Z8669 Personal history of other diseases of the nervous system and sense organs: Secondary | ICD-10-CM | POA: Diagnosis not present

## 2022-09-16 MED ORDER — ALBUTEROL SULFATE HFA 108 (90 BASE) MCG/ACT IN AERS: INHALATION_SPRAY | RESPIRATORY_TRACT | 11 refills | Status: DC

## 2022-09-16 MED ORDER — SUMATRIPTAN SUCCINATE 50 MG PO TABS: 50.0000 mg | ORAL_TABLET | Freq: Once | ORAL | 1 refills | Status: DC

## 2022-09-16 MED ORDER — ESCITALOPRAM OXALATE 5 MG PO TABS: 5.0000 mg | ORAL_TABLET | Freq: Every day | ORAL | 1 refills | Status: DC

## 2022-09-16 NOTE — Progress Notes (Signed)
Established Patient Office Visit  Subjective   Patient ID: Jody Gay, female   DOB: Sep 09, 1981 Age: 41 y.o. MRN: [id number]   Chief Complaint  Patient presents with   Establish Care   HPI Jody Gay 41 year old female presenting today to transfer care to new PCP and for the following:  Exercise-induced bronchospasm: Has an albuterol inhaler that she uses on an as-needed basis.  Notes that her symptoms are provoked by exercise, anxiety, and allergies.  Notes that most of her problems are in the fall although spring does give her some issues periodically.  Anxiety: Taking Lexapro 5 mg daily, tolerating well without side effects.  Notes that she does not like to take medications if she does not have to and she tried to stop this about a year ago.  Unfortunately her severe anxiety/panic came back so she ended up restarting it.  Previously took Klonopin however she has not taken a dose in approximately 2 years.  She does note that she had it on hand last year but did not need to take it.  Has a history of migraines and has not had to take medication in the past year.  She previously took Imitrex as needed.  She reports having a migraine last week and she was able to take the Imitrex she had on hand.  Notes that she had to take 2 doses of it since she waited too long.  She has been seeing Dr. Benjamin Stain for [place] fasciitis.  Notes that she is having significant foot pain that is worse in the evenings.  Has changed her footwear and is working on home exercises.  Reports that she had an injection in her left heel but has not done so in her right heel yet.  Does not have heel lifts at home. Objective:    Vitals:   09/16/22 1352 09/16/22 1434  BP: (!) 148/93 131/89  Pulse: 81   Height: 5\' 5"  (1.651 m)   Weight: 176 lb (79.8 kg)   SpO2: 98%   BMI (Calculated): 29.29     Physical Exam Vitals and nursing note reviewed.  Constitutional:      General: She is not in acute distress.     Appearance: Normal appearance. She is not ill-appearing.  HENT:     Head: Normocephalic and atraumatic.  Cardiovascular:     Rate and Rhythm: Normal rate and regular rhythm.     Pulses: Normal pulses.     Heart sounds: Normal heart sounds.  Pulmonary:     Effort: Pulmonary effort is normal. No respiratory distress.     Breath sounds: Normal breath sounds. No wheezing, rhonchi or rales.  Skin:    General: Skin is warm and dry.  Neurological:     Mental Status: She is alert and oriented to person, place, and time.  Psychiatric:        Mood and Affect: Mood normal.        Behavior: Behavior normal.        Thought Content: Thought content normal.        Judgment: Judgment normal.   No results found for this or any previous visit (from the past 24 hour(s)).     The 10-year ASCVD risk score (Arnett DK, et al., 2019) is: 0.8%   Values used to calculate the score:     Age: 70 years     Sex: Female     Is Non-Hispanic African American: No     Diabetic: No  Tobacco smoker: No     Systolic Blood Pressure: 528 mmHg     Is BP treated: No     HDL Cholesterol: 51 mg/dL     Total Cholesterol: 210 mg/dL   Assessment & Plan:   1. Encounter to establish care Reviewed available information and discussed care concerns with patient.   2. Exercise-induced bronchoconstriction Refilling albuterol inhaler.  Continue as needed use.  Recommend daily antihistamine during spring and fall. - albuterol (VENTOLIN HFA) 108 (90 Base) MCG/ACT inhaler; 2 puffs 15 minutes before physical activity, may also use for episodes of mild shortness of breath.  Dispense: 2 each; Refill: 11  3. Generalized anxiety disorder Symptoms stable.  Continue Lexapro 5 mg daily. - escitalopram (LEXAPRO) 5 MG tablet; Take 1 tablet (5 mg total) by mouth daily.  Dispense: 90 tablet; Refill: 1  4. History of migraine Refilling Imitrex for as needed use with migraines. - SUMAtriptan (IMITREX) 50 MG tablet; Take 1 tablet (50  mg total) by mouth once for 1 dose. May repeat in 2 hours if headache persists or recurs.  Dispense: 10 tablet; Refill: 1  5. Plantar fasciitis, bilateral Managed by Dr. Dianah Field.  Heel lifts given to see if these are helpful.  Continue current treatment plan.  Consider custom orthotics if desired.  Make sure to wear supportive shoes on a daily basis and avoid being barefoot.  Return in about 6 months (around 03/18/2023) for mood follow up.  ___________________________________________ Clearnce Sorrel, DNP, APRN, FNP-BC Primary Care and [place]

## 2022-11-06 ENCOUNTER — Other Ambulatory Visit: Payer: Self-pay | Admitting: Sports Medicine

## 2022-11-06 DIAGNOSIS — M722 Plantar fascial fibromatosis: Secondary | ICD-10-CM

## 2022-11-20 ENCOUNTER — Encounter: Payer: Self-pay | Admitting: Medical-Surgical

## 2022-11-20 ENCOUNTER — Ambulatory Visit (INDEPENDENT_AMBULATORY_CARE_PROVIDER_SITE_OTHER): Payer: No Typology Code available for payment source

## 2022-11-20 ENCOUNTER — Ambulatory Visit: Payer: No Typology Code available for payment source | Admitting: Medical-Surgical

## 2022-11-20 VITALS — BP 139/82 | HR 77 | Resp 20 | Ht 65.0 in | Wt 175.5 lb

## 2022-11-20 DIAGNOSIS — R200 Anesthesia of skin: Secondary | ICD-10-CM

## 2022-11-20 DIAGNOSIS — F418 Other specified anxiety disorders: Secondary | ICD-10-CM | POA: Diagnosis not present

## 2022-11-20 DIAGNOSIS — Z8249 Family history of ischemic heart disease and other diseases of the circulatory system: Secondary | ICD-10-CM | POA: Diagnosis not present

## 2022-11-20 DIAGNOSIS — M542 Cervicalgia: Secondary | ICD-10-CM | POA: Diagnosis not present

## 2022-11-20 DIAGNOSIS — N393 Stress incontinence (female) (male): Secondary | ICD-10-CM | POA: Insufficient documentation

## 2022-11-20 MED ORDER — CLONAZEPAM 0.5 MG PO TABS: 0.2500 mg | ORAL_TABLET | Freq: Two times a day (BID) | ORAL | 0 refills | Status: DC | PRN

## 2022-11-20 NOTE — Progress Notes (Signed)
Established Patient Office Visit  Subjective   Patient ID: Jody Gay, female   DOB: 05/25/1981 Age: 41 y.o. MRN: [id number]   Chief Complaint  Patient presents with   Numbness    Numbness in left hand   HPI Pleasant 41 year old female presenting today with reports of left hand numbness that has been going on for several weeks/months. She was recently seen by her chiropractor for neck, upper back, and left arm pain/numbness. He told her that he thought her symptoms might be related to a heart problem. This understandably caused significant health related anxiety which is a common problem for her. Today she notes that her right hand goes numb when sleeping. It affects her fingers more than her palms/arms. During the day, she has these symptoms as well but shaking her hands can bring the feeling back. Of note, she has a family history of cardiac disease but has had no symptoms herself. Is able to tolerate increased activity without chest pain or dyspnea. No swelling of the lower extremities, dizziness, diaphoresis, nausea, unusual headaches, or syncope. No palpitations.   Reports that her left neck still hurts as does her right jaw and just under the right ear. Stays quite stressed and anxious and admits that she may clench her teeth. Not sure if she grinds her teeth at night.    Objective:    Vitals:   11/20/22 1401  BP: 139/82  Pulse: 77  Resp: 20  Height: 5\' 5"  (1.651 m)  Weight: 175 lb 8 oz (79.6 kg)  SpO2: 99%  BMI (Calculated): 29.2    Physical Exam Vitals and nursing note reviewed.  Constitutional:      General: She is not in acute distress.    Appearance: Normal appearance. She is not ill-appearing.  HENT:     Head: Normocephalic and atraumatic.     Left Ear: Tympanic membrane, ear canal and external ear normal. There is no impacted cerumen.  Cardiovascular:     Rate and Rhythm: Normal rate and regular rhythm.     Pulses: Normal pulses.     Heart sounds: Normal heart  sounds.  Pulmonary:     Effort: Pulmonary effort is normal. No respiratory distress.     Breath sounds: Normal breath sounds. No wheezing, rhonchi or rales.  Musculoskeletal:     Cervical back: Normal range of motion and neck supple.     Comments: Cap refill to bilateral hands brisk. No temperature changes. Color pink, skin warm and dry. Radial pulses +2, even. Grip strength equal bilaterally. Full ROM to bilateral upper extremities. Elbow and shoulder strength normal. + Phalen's, - Tinel's, - Wright's tests.   Lymphadenopathy:     Cervical: No cervical adenopathy.  Skin:    General: Skin is warm and dry.  Neurological:     Mental Status: She is alert and oriented to person, place, and time.  Psychiatric:        Mood and Affect: Mood normal.        Behavior: Behavior normal.        Thought Content: Thought content normal.        Judgment: Judgment normal.    No results found for this or any previous visit (from the past 24 hour(s)).     The 10-year ASCVD risk score (Arnett DK, et al., 2019) is: 0.9%   Values used to calculate the score:     Age: 19 years     Sex: Female     Is Non-Hispanic African  American: No     Diabetic: No     Tobacco smoker: No     Systolic Blood Pressure: 139 mmHg     Is BP treated: No     HDL Cholesterol: 51 mg/dL     Total Cholesterol: 210 mg/dL   Assessment & Plan:   1. Numbness of left hand Getting cervical spine x-rays. Strongly suspect this is simply carpal tunnel syndrome but possibly related to some cervical radiculopathy. BUE neurovascularly intact. Exam overall reassuring. Recommend increasing Voltaren back to twice daily. Reviewed recommendation for night time bracing for the next 6 weeks. Printed exercises given for carpal tunnel syndrome for completion at home.  - DG Cervical Spine Complete; Future  2. Family history of heart disease With her significant anxiety regarding health issues, she is having hard time not worrying about heart  health after the chiropractor's suggestion. Because she does have a family history and would greatly benefit from reassurance, ordering a Cardiac Calcium score.  - CT CARDIAC SCORING (SELF PAY ONLY); Future  3. Anxiety about health 4. Situational anxiety Reassurance provided regarding today's presentation. Continue Lexapro 5mg  daily. Discussed the tendency to worry about health. While this is expected and at times helpful, it can become detrimental to mental health when it is allowed to go too far. Recommend working on self control measures to stop the train of anxiety related to health concerns. Advised that relying on a medication to change behavior is not recommended and she will need to make conscious efforts to get this under control. Since she has been reliable in the past with only using it sparingly, refilling clonazepam with the recommendation that she only uses it as a last effort when ALL other measures have failed. Patient verbalized understanding and is agreeable to the plan.  - clonazePAM (KLONOPIN) 0.5 MG tablet; Take 0.5-1 tablets (0.25-0.5 mg total) by mouth 2 (two) times daily as needed for anxiety (use sparingly for severe symptoms).  Dispense: 20 tablet; Refill: 0   Return if symptoms worsen or fail to improve.  ___________________________________________ , DNP, APRN, FNP-BC Primary Care and Sports Medicine [facility] [place]

## 2022-11-21 ENCOUNTER — Ambulatory Visit (INDEPENDENT_AMBULATORY_CARE_PROVIDER_SITE_OTHER): Payer: Self-pay

## 2022-11-21 DIAGNOSIS — Z8249 Family history of ischemic heart disease and other diseases of the circulatory system: Secondary | ICD-10-CM

## 2023-02-23 ENCOUNTER — Other Ambulatory Visit: Payer: Self-pay | Admitting: Medical-Surgical

## 2023-02-23 DIAGNOSIS — Z8669 Personal history of other diseases of the nervous system and sense organs: Secondary | ICD-10-CM

## 2023-03-09 ENCOUNTER — Other Ambulatory Visit: Payer: Self-pay | Admitting: Sports Medicine

## 2023-03-09 DIAGNOSIS — M722 Plantar fascial fibromatosis: Secondary | ICD-10-CM

## 2023-03-17 ENCOUNTER — Other Ambulatory Visit: Payer: Self-pay | Admitting: Medical-Surgical

## 2023-03-17 DIAGNOSIS — F411 Generalized anxiety disorder: Secondary | ICD-10-CM

## 2023-03-24 ENCOUNTER — Ambulatory Visit: Payer: No Typology Code available for payment source | Admitting: Medical-Surgical

## 2023-03-24 ENCOUNTER — Encounter: Payer: Self-pay | Admitting: Medical-Surgical

## 2023-03-24 VITALS — BP 122/79 | HR 77 | Resp 20 | Ht 65.0 in | Wt 177.5 lb

## 2023-03-24 DIAGNOSIS — M722 Plantar fascial fibromatosis: Secondary | ICD-10-CM | POA: Diagnosis not present

## 2023-03-24 DIAGNOSIS — F411 Generalized anxiety disorder: Secondary | ICD-10-CM | POA: Diagnosis not present

## 2023-03-24 MED ORDER — ESCITALOPRAM OXALATE 5 MG PO TABS: 5.0000 mg | ORAL_TABLET | Freq: Every day | ORAL | 1 refills | Status: DC

## 2023-03-24 MED ORDER — ACYCLOVIR 400 MG PO TABS: 400.0000 mg | ORAL_TABLET | Freq: Three times a day (TID) | ORAL | 11 refills | Status: AC

## 2023-03-24 MED ORDER — ESCITALOPRAM OXALATE 5 MG PO TABS: 5.0000 mg | ORAL_TABLET | Freq: Every day | ORAL | 0 refills | Status: DC

## 2023-03-24 MED ORDER — DICLOFENAC SODIUM 75 MG PO TBEC: 75.0000 mg | DELAYED_RELEASE_TABLET | Freq: Two times a day (BID) | ORAL | 1 refills | Status: DC

## 2023-03-24 NOTE — Progress Notes (Signed)
        Established patient visit  History, exam, impression, and plan:  1. Generalized anxiety disorder Very pleasant 42 year old female presenting today for follow-up on mood.  Has been taking Lexapro 5 mg daily, tolerating well without side effects.  Feels that the medication is adequately controlling her anxiety and depression symptoms.  Has clonazepam to use on hand as needed but uses this extremely sparingly.  Denies SI/HI.  Normal thought content, affect, and mood.  Symptoms stable, continue Lexapro 5 mg daily. - escitalopram (LEXAPRO) 5 MG tablet; Take 1 tablet (5 mg total) by mouth daily.  Dispense: 90 tablet; Refill: 1  2. Plantar fasciitis, bilateral History of bilateral plantar fasciitis as well as upper extremity pain and paresthesias.  Doing well on diclofenac 75 mg twice daily.  Has been trying to wean this down as she would like to eventually stop taking it however 1 dose every 24 hours has not been sufficient to control her discomfort.  Refilling diclofenac for twice daily dosing.  If desired to wean down, may need to try increasing the time between doses slowly. - diclofenac (VOLTAREN) 75 MG EC tablet; Take 1 tablet (75 mg total) by mouth 2 (two) times daily.  Dispense: 180 tablet; Refill: 1   Procedures performed this visit: None.  Return in about 6 months (around 09/23/2023) for mood follow up.  __________________________________ Thayer Ohm, DNP, APRN, FNP-BC Primary Care and Sports Medicine [facility] [place]

## 2023-07-23 ENCOUNTER — Other Ambulatory Visit: Payer: Self-pay | Admitting: Medical-Surgical

## 2023-07-23 DIAGNOSIS — F418 Other specified anxiety disorders: Secondary | ICD-10-CM

## 2023-08-17 ENCOUNTER — Telehealth: Payer: Self-pay | Admitting: General Practice

## 2023-08-17 NOTE — Transitions of Care (Post Inpatient/ED Visit) (Signed)
08/17/2023  Name: Jody Gay MRN: [id number] DOB: Jul 23, 1981  Today's TOC FU Call Status: Today's TOC FU Call Status:: Successful TOC FU Call Completed TOC FU Call Complete Date: 08/17/23 Patient's Name and Date of Birth confirmed.  Transition Care Management Follow-up Telephone Call Date of Discharge: 08/15/23 Discharge Facility: Other ([facility]) Name of Other ([facility]) Discharge Facility: [facility] Type of Discharge: Emergency Department Reason for ED Visit: Neurologic Neurologic Diagnosis:  (headache) How have you been since you were released from the hospital?: Better Any questions or concerns?: No  Items Reviewed: Did you receive and understand the discharge instructions provided?: Yes Medications obtained,verified, and reconciled?: Yes (Medications Reviewed) Any new allergies since your discharge?: No Dietary orders reviewed?: NA Do you have support at home?: Yes  Medications Reviewed Today: Medications Reviewed Today   Medications were not reviewed in this encounter     Home Care and Equipment/Supplies: Were Home Health Services Ordered?: NA Any new equipment or medical supplies ordered?: NA  Functional Questionnaire: Do you need assistance with bathing/showering or dressing?: No Do you need assistance with meal preparation?: No Do you need assistance with eating?: No Do you have difficulty maintaining continence: No Do you need assistance with getting out of bed/getting out of a chair/moving?: No Do you have difficulty managing or taking your medications?: No  Follow up appointments reviewed: PCP Follow-up appointment confirmed?: Yes Date of PCP follow-up appointment?: 08/20/23 Follow-up Provider: Christen Butter [facility] Follow-up appointment confirmed?: NA Do you need transportation to your follow-up appointment?: No Do you understand care options if your condition(s) worsen?: Yes-patient verbalized understanding  SDOH Interventions Today     Flowsheet Row Most Recent Value  SDOH Interventions   Transportation Interventions Intervention Not Indicated       SIGNATURE Modesto Charon, RN BSN Nurse Health Advisor

## 2023-08-20 ENCOUNTER — Ambulatory Visit: Payer: No Typology Code available for payment source | Admitting: Medical-Surgical

## 2023-08-20 ENCOUNTER — Encounter: Payer: Self-pay | Admitting: Medical-Surgical

## 2023-08-20 VITALS — BP 134/79 | HR 85 | Resp 20 | Ht 65.0 in | Wt 174.0 lb

## 2023-08-20 DIAGNOSIS — Z8669 Personal history of other diseases of the nervous system and sense organs: Secondary | ICD-10-CM | POA: Diagnosis not present

## 2023-08-20 DIAGNOSIS — Z09 Encounter for follow-up examination after completed treatment for conditions other than malignant neoplasm: Secondary | ICD-10-CM | POA: Diagnosis not present

## 2023-08-20 NOTE — Progress Notes (Signed)
        Established patient visit  History, exam, impression, and plan:  1. Hospital discharge follow-up 2. History of migraine Pleasant 42 year old female presenting today for hospital discharge follow-up.  Has a history of migraines that are usually manageable at home.  Unfortunately, on Friday 9/13, she was struggling with the tail end of an upper respiratory infection.  She reports she was at [place] [place] with her child when she developed a severe coughing episode.  She ended up with prolonged strong coughing that resulted in development of severe head pain and dizziness.  She made at home and lay down to rest.  She did take sumatriptan however her father is very anxious and convinced her to go to the ED.  In the ED, her workup was comprehensive and found no concerns.  She was treated with several medications that helped resolve her symptoms.  Notes this was the worst migraine she has ever had.  Usually has a couple of days of residual dizziness after a migraine however this has persisted a little longer.  Her symptoms are continue to improve and she is almost back at baseline.  She does have some questions about abnormal labs which we discussed in detail today.  Reassurance provided.  No changes to medications and no indication to do further workup at this time.   Procedures performed this visit: None.  Return in about 6 months (around 02/17/2024) for mood follow up.  __________________________________ Thayer Ohm, DNP, APRN, FNP-BC Primary Care and Sports Medicine [facility] [place]

## 2023-09-13 ENCOUNTER — Other Ambulatory Visit: Payer: Self-pay | Admitting: Medical-Surgical

## 2023-09-13 DIAGNOSIS — M722 Plantar fascial fibromatosis: Secondary | ICD-10-CM

## 2023-09-24 ENCOUNTER — Ambulatory Visit: Payer: No Typology Code available for payment source | Admitting: Medical-Surgical

## 2023-09-24 ENCOUNTER — Other Ambulatory Visit: Payer: Self-pay | Admitting: Medical-Surgical

## 2023-09-24 DIAGNOSIS — F418 Other specified anxiety disorders: Secondary | ICD-10-CM

## 2023-10-08 ENCOUNTER — Other Ambulatory Visit: Payer: Self-pay | Admitting: Medical-Surgical

## 2023-10-08 DIAGNOSIS — F411 Generalized anxiety disorder: Secondary | ICD-10-CM

## 2023-12-15 ENCOUNTER — Other Ambulatory Visit: Payer: Self-pay | Admitting: Medical-Surgical

## 2023-12-15 DIAGNOSIS — M722 Plantar fascial fibromatosis: Secondary | ICD-10-CM

## 2024-01-15 ENCOUNTER — Other Ambulatory Visit: Payer: Self-pay | Admitting: Medical-Surgical

## 2024-01-15 DIAGNOSIS — F418 Other specified anxiety disorders: Secondary | ICD-10-CM

## 2024-02-18 ENCOUNTER — Ambulatory Visit: Payer: No Typology Code available for payment source | Admitting: Medical-Surgical

## 2024-02-18 ENCOUNTER — Encounter: Payer: Self-pay | Admitting: Medical-Surgical

## 2024-02-18 VITALS — BP 121/77 | HR 74 | Resp 20 | Ht 65.0 in | Wt 184.5 lb

## 2024-02-18 DIAGNOSIS — J45990 Exercise induced bronchospasm: Secondary | ICD-10-CM | POA: Diagnosis not present

## 2024-02-18 DIAGNOSIS — M722 Plantar fascial fibromatosis: Secondary | ICD-10-CM

## 2024-02-18 DIAGNOSIS — F411 Generalized anxiety disorder: Secondary | ICD-10-CM

## 2024-02-18 MED ORDER — ESCITALOPRAM OXALATE 5 MG PO TABS: 5.0000 mg | ORAL_TABLET | Freq: Every day | ORAL | 1 refills | Status: DC

## 2024-02-18 MED ORDER — ALBUTEROL SULFATE HFA 108 (90 BASE) MCG/ACT IN AERS: INHALATION_SPRAY | RESPIRATORY_TRACT | 11 refills | Status: AC

## 2024-02-18 NOTE — Progress Notes (Unsigned)
   Established patient visit  History, exam, impression, and plan:  1. Cough, unspecified type 2. Sore throat Jody Gay 43 year old female presenting today with reports of upper respiratory symptoms.  Notes that this started approximately 5 days ago with her nose and eyes burning.  On Saturday, she felt unwell and by Sunday she notes that she felt awful.  Her throat has been sore and she has been coughing.  Notes that her cough has been occasionally productive of small amounts of pink-tinged sputum, usually in the morning.  Her left ear has now developed pressure/discomfort and she has significant sinus congestion.  She has tried drinking hot tea and increasing her fluid consumption.  She has also used Chloraseptic for the sore throat.  Continues to have significant issues with hoarseness.  She saw ENT and they recommended just using Atrovent nasal spray twice daily and follow-up with them after a couple of months.  POCT strep, flu, and COVID testing all negative today.  Below for physical exam. - POCT rapid strep A - POCT Influenza A/B - POC COVID-19  3. Viral URI with cough Despite negative testing, suspect that her symptoms are truly related to a viral URI.  Discussed the timeline for resolution of a viral illness since symptoms can last 7 to 14 days.  With her severe hoarseness and significant sinus congestion, treating with Decadron 4 mg twice daily.  Adding Tussionex for cough suppression.  Okay to use Tylenol/ibuprofen for fever/discomfort.  Continue conservative measures at home.  If improvement in symptoms not noted over the next 2 to 3 days or symptoms improved but quickly worsen again, consider adding antibiotic therapy for secondary bacterial infection.   Procedures performed this visit: None.  Return if symptoms worsen or fail to improve.  __________________________________ Thayer Ohm, DNP, APRN, FNP-BC Primary Care and Sports Medicine [facility] [place]

## 2024-03-19 ENCOUNTER — Other Ambulatory Visit: Payer: Self-pay | Admitting: Medical-Surgical

## 2024-03-19 DIAGNOSIS — M722 Plantar fascial fibromatosis: Secondary | ICD-10-CM

## 2024-03-31 ENCOUNTER — Other Ambulatory Visit: Payer: Self-pay | Admitting: Medical-Surgical

## 2024-03-31 DIAGNOSIS — F418 Other specified anxiety disorders: Secondary | ICD-10-CM

## 2024-04-01 NOTE — Telephone Encounter (Signed)
 Last OV 02/18/2024  Last filled 01/09/2024

## 2024-05-05 ENCOUNTER — Other Ambulatory Visit: Payer: Self-pay | Admitting: Medical-Surgical

## 2024-05-05 DIAGNOSIS — F418 Other specified anxiety disorders: Secondary | ICD-10-CM

## 2024-05-06 NOTE — Telephone Encounter (Signed)
 Last office visit 02/18/2024  Last filled 04/01/2024  Upcoming appointment 08/23/2024

## 2024-06-06 ENCOUNTER — Other Ambulatory Visit: Payer: Self-pay | Admitting: Medical-Surgical

## 2024-06-06 DIAGNOSIS — F418 Other specified anxiety disorders: Secondary | ICD-10-CM

## 2024-06-06 NOTE — Telephone Encounter (Signed)
 Last OV 02/18/2024  Upcoming appointment 08/23/2024  Last filled  05/06/2024

## 2024-08-02 ENCOUNTER — Encounter: Payer: Self-pay | Admitting: Sports Medicine

## 2024-08-03 ENCOUNTER — Other Ambulatory Visit: Payer: Self-pay | Admitting: Medical-Surgical

## 2024-08-03 DIAGNOSIS — F418 Other specified anxiety disorders: Secondary | ICD-10-CM

## 2024-08-03 NOTE — Telephone Encounter (Signed)
 Last filled 06/06/2024  Last OV 02/18/2024  Upcoming Appointment 08/23/2024

## 2024-08-23 ENCOUNTER — Encounter: Payer: Self-pay | Admitting: Medical-Surgical

## 2024-08-23 ENCOUNTER — Ambulatory Visit (INDEPENDENT_AMBULATORY_CARE_PROVIDER_SITE_OTHER): Admitting: Medical-Surgical

## 2024-08-23 VITALS — BP 129/82 | HR 75 | Temp 98.6°F | Resp 18 | Ht 65.0 in | Wt 185.8 lb

## 2024-08-23 DIAGNOSIS — F418 Other specified anxiety disorders: Secondary | ICD-10-CM

## 2024-08-23 DIAGNOSIS — Z0000 Encounter for general adult medical examination without abnormal findings: Secondary | ICD-10-CM

## 2024-08-23 DIAGNOSIS — Z1231 Encounter for screening mammogram for malignant neoplasm of breast: Secondary | ICD-10-CM | POA: Diagnosis not present

## 2024-08-23 DIAGNOSIS — Z8639 Personal history of other endocrine, nutritional and metabolic disease: Secondary | ICD-10-CM | POA: Diagnosis not present

## 2024-08-23 DIAGNOSIS — F411 Generalized anxiety disorder: Secondary | ICD-10-CM

## 2024-08-23 DIAGNOSIS — Z8669 Personal history of other diseases of the nervous system and sense organs: Secondary | ICD-10-CM

## 2024-08-23 MED ORDER — CLONAZEPAM 0.5 MG PO TABS: ORAL_TABLET | ORAL | 3 refills | Status: AC

## 2024-08-23 MED ORDER — ESCITALOPRAM OXALATE 5 MG PO TABS: 5.0000 mg | ORAL_TABLET | Freq: Every day | ORAL | 3 refills | Status: AC

## 2024-08-23 MED ORDER — SUMATRIPTAN SUCCINATE 50 MG PO TABS: ORAL_TABLET | ORAL | 5 refills | Status: AC

## 2024-08-23 NOTE — Progress Notes (Signed)
 Complete physical exam  Patient: Jody Gay   DOB: 1980/12/24   43 y.o. Female  MRN: [id number]  Subjective:    Chief Complaint  Patient presents with   Annual Exam    Patient is here for her annual physical. Patient is fasting.    Jody Gay is a 43 y.o. female who presents today for a complete physical exam. She reports consuming a gluten free diet. The patient does not participate in regular exercise at present. She generally feels well. She reports sleeping well. She does not have additional problems to discuss today.    Most recent fall risk assessment:    08/23/2024    9:54 AM  Fall Risk   Number falls in past yr: 0  Injury with Fall? 0  Risk for fall due to : No Fall Risks  Follow up Falls evaluation completed     Most recent depression screenings:    08/23/2024    9:54 AM 02/18/2024   11:58 AM  PHQ 2/9 Scores  PHQ - 2 Score 0 0  PHQ- 9 Score 1 0    Vision:Within last year and Dental: No current dental problems and Receives regular dental care    Patient Care Team: Willo Mini, NP as PCP - General (Nurse Practitioner) Curtis Debby PARAS, MD as Consulting Physician (Sports Medicine)   Outpatient Medications Prior to Visit  Medication Sig   albuterol  (VENTOLIN  HFA) 108 (90 Base) MCG/ACT inhaler 2 puffs 15 minutes before physical activity, may also use for episodes of mild shortness of breath.   cetirizine (ZYRTEC) 10 MG tablet Take 10 mg by mouth daily. (Patient taking differently: Take 10 mg by mouth as needed for allergies.)   [DISCONTINUED] clonazePAM  (KLONOPIN ) 0.5 MG tablet TAKE 0.5-1 TABS BY MOUTH 2 TIMES DAILY AS NEEDED FOR ANXIETY (USE SPARINGLY FOR SEVERE SYMPTOMS).   [DISCONTINUED] escitalopram  (LEXAPRO ) 5 MG tablet Take 1 tablet (5 mg total) by mouth daily.   [DISCONTINUED] SUMAtriptan  (IMITREX ) 50 MG tablet TAKE 1 TABLET BY MOUTH ONCE FOR 1 DOSE. MAY REPEAT IN 2 HOURS IF HEADACHE PERSISTS OR RECURS.   [DISCONTINUED] diclofenac  (VOLTAREN ) 75  MG EC tablet Take 1 tablet (75 mg total) by mouth daily.   Facility-Administered Medications Prior to Visit  Medication Dose Route Frequency Provider   albuterol  (VENTOLIN  HFA) 108 (90 Base) MCG/ACT inhaler 4 puff  4 puff Inhalation Once Gay, Natalie, DO    Review of Systems  Gastrointestinal:  Positive for diarrhea and heartburn.  Neurological:  Positive for tingling (Left hand numbness).  Psychiatric/Behavioral:  Negative for depression. The patient is nervous/anxious.      Objective:     BP 129/82   Pulse 75   Temp 98.6 F (37 C) (Oral)   Resp 18   Ht 5' 5 (1.651 m)   Wt 185 lb 12.8 oz (84.3 kg)   LMP 08/02/2024 (Approximate)   SpO2 99%   BMI 30.92 kg/m    Physical Exam Constitutional:      General: She is not in acute distress.    Appearance: Normal appearance. She is not ill-appearing.  HENT:     Head: Normocephalic and atraumatic.     Right Ear: Tympanic membrane, ear canal and external ear normal. There is no impacted cerumen.     Left Ear: Tympanic membrane, ear canal and external ear normal. There is no impacted cerumen.     Nose: Nose normal. No congestion or rhinorrhea.     Mouth/Throat:     Mouth:  Mucous membranes are moist.     Pharynx: No oropharyngeal exudate or posterior oropharyngeal erythema.  Eyes:     General: No scleral icterus.       Right eye: No discharge.        Left eye: No discharge.     Extraocular Movements: Extraocular movements intact.     Conjunctiva/sclera: Conjunctivae normal.     Pupils: Pupils are equal, round, and reactive to light.  Neck:     Thyroid: No thyromegaly.     Vascular: No carotid bruit or JVD.     Trachea: Trachea normal.  Cardiovascular:     Rate and Rhythm: Normal rate and regular rhythm.     Pulses: Normal pulses.     Heart sounds: Normal heart sounds. No murmur heard.    No friction rub. No gallop.  Pulmonary:     Effort: Pulmonary effort is normal. No respiratory distress.     Breath sounds: Normal  breath sounds. No wheezing.  Abdominal:     General: Bowel sounds are normal. There is no distension.     Palpations: Abdomen is soft.     Tenderness: There is no abdominal tenderness. There is no guarding.  Musculoskeletal:        General: Normal range of motion.     Cervical back: Normal range of motion and neck supple.  Lymphadenopathy:     Cervical: No cervical adenopathy.  Skin:    General: Skin is warm and dry.  Neurological:     Mental Status: She is alert and oriented to person, place, and time.     Cranial Nerves: No cranial nerve deficit.  Psychiatric:        Mood and Affect: Mood normal.        Behavior: Behavior normal.        Thought Content: Thought content normal.        Judgment: Judgment normal.      No results found for any visits on 08/23/24.     Assessment & Plan:    Routine Health Maintenance and Physical Exam   There is no immunization history on file for this patient.  Health Maintenance  Topic Date Due   Hepatitis B Vaccines 19-59 Average Risk (1 of 3 - 19+ 3-dose series) Never done   HPV VACCINES (1 - 3-dose SCDM series) Never done   Mammogram  Never done   Influenza Vaccine  Never done   COVID-19 Vaccine (1 - 2024-25 season) Never done   DTaP/Tdap/Td (1 - Tdap) 02/17/2025 (Originally 03/27/2001)   Cervical Cancer Screening (HPV/Pap Cotest)  03/19/2026   Pneumococcal Vaccine  Aged Out   Meningococcal B Vaccine  Aged Out   Hepatitis C Screening  Discontinued   HIV Screening  Discontinued    Discussed health benefits of physical activity, and encouraged her to engage in regular exercise appropriate for her age and condition.  1. Annual physical exam (Primary) Checking labs as below. UTD on preventative care. Wellness information provided with AVS. - CBC with Differential/Platelet - CMP14+EGFR - Lipid panel  2. Generalized anxiety disorder Stable but continues to struggle with health related anxiety. Would like to discontinue Lexapro  at  some point but not right now. Continue Lexapro  5mg  daily. Continue prn use of clonazepam  but cautioned to use very sparingly.  - escitalopram  (LEXAPRO ) 5 MG tablet; Take 1 tablet (5 mg total) by mouth daily.  Dispense: 90 tablet; Refill: 3  3. Encounter for screening mammogram for malignant neoplasm of breast Mammogram  ordered.  - MM DIGITAL SCREENING BILATERAL; Future  4. History of high cholesterol Checking lipids.  - Lipid panel  5. Situational anxiety Continue clonazepam , see above. - clonazePAM  (KLONOPIN ) 0.5 MG tablet; TAKE 0.5-1 TABS BY MOUTH 2 TIMES DAILY AS NEEDED FOR ANXIETY (USE SPARINGLY FOR SEVERE SYMPTOMS).  Dispense: 20 tablet; Refill: 3  6. History of migraine Well controlled with estimated frequency of once per year. Refilling Imitrex  for prn use.  - SUMAtriptan  (IMITREX ) 50 MG tablet; May repeat in 2 hours if headache persists or recurs.TAKE 1 TABLET BY MOUTH ONCE FOR 1 DOSE. MAY REPEAT IN 2 HOURS IF HEADACHE PERSISTS OR RECURS.  Dispense: 9 tablet; Refill: 5  Return in about 6 months (around 02/20/2025) for mood follow up or sooner if needed.   Aylah Yeary, NP

## 2024-08-23 NOTE — Patient Instructions (Signed)
 Preventive Care 58-42 Years Old, Female  Preventive care refers to lifestyle choices and visits with your health care provider that can promote health and wellness. Preventive care visits are also called wellness exams.  What can I expect for my preventive care visit?  Counseling  Your health care provider may ask you questions about your:  Medical history, including:  Past medical problems.  Family medical history.  Pregnancy history.  Current health, including:  Menstrual cycle.  Method of birth control.  Emotional well-being.  Home life and relationship well-being.  Sexual activity and sexual health.  Lifestyle, including:  Alcohol, nicotine or tobacco, and drug use.  Access to firearms.  Diet, exercise, and sleep habits.  Work and work Astron[employer].  Sunscreen use.  Safety issues such as seatbelt and bike helmet use.  Physical exam  Your health care provider will check your:  Height and weight. These may be used to calculate your BMI (body mass index). BMI is a measurement that tells if you are at a healthy weight.  Waist circumference. This measures the distance around your waistline. This measurement also tells if you are at a healthy weight and may help predict your risk of certain diseases, such as type 2 diabetes and high blood pressure.  Heart rate and blood pressure.  Body temperature.  Skin for abnormal spots.  What immunizations do I need?    Vaccines are usually given at various ages, according to a schedule. Your health care provider will recommend vaccines for you based on your age, medical history, and lifestyle or other factors, such as travel or where you work.  What tests do I need?  Screening  Your health care provider may recommend screening tests for certain conditions. This may include:  Lipid and cholesterol levels.  Diabetes screening. This is done by checking your blood sugar (glucose) after you have not eaten for a while (fasting).  Pelvic exam and Pap test.  Hepatitis B test.  Hepatitis C  test.  HIV (human immunodeficiency virus) test.  STI (sexually transmitted infection) testing, if you are at risk.  Lung cancer screening.  Colorectal cancer screening.  Mammogram. Talk with your health care provider about when you should start having regular mammograms. This may depend on whether you have a family history of breast cancer.  BRCA-related cancer screening. This may be done if you have a family history of breast, ovarian, tubal, or peritoneal cancers.  Bone density scan. This is done to screen for osteoporosis.  Talk with your health care provider about your test results, treatment options, and if necessary, the need for more tests.  Follow these instructions at home:  Eating and drinking    Eat a diet that includes fresh fruits and vegetables, whole grains, lean protein, and low-fat dairy products.  Take vitamin and mineral supplements as recommended by your health care provider.  Do not drink alcohol if:  Your health care provider tells you not to drink.  You are pregnant, may be pregnant, or are planning to become pregnant.  If you drink alcohol:  Limit how much you have to 0-1 drink a day.  Know how much alcohol is in your drink. In the [place] one drink equals one 12 oz bottle of beer (355 mL), one 5 oz glass of wine (148 mL), or one 1 oz glass of hard liquor (44 mL).  Lifestyle  Brush your teeth every morning and night with fluoride toothpaste. Floss one time each day.  Exercise for at least  30 minutes 5 or more days each week.  Do not use any products that contain nicotine or tobacco. These products include cigarettes, chewing tobacco, and vaping devices, such as e-cigarettes. If you need help quitting, ask your health care provider.  Do not use drugs.  If you are sexually active, practice safe sex. Use a condom or other form of protection to prevent STIs.  If you do not wish to become pregnant, use a form of birth control. If you plan to become pregnant, see your health care provider for a  prepregnancy visit.  Take aspirin only as told by your health care provider. Make sure that you understand how much to take and what form to take. Work with your health care provider to find out whether it is safe and beneficial for you to take aspirin daily.  Find healthy ways to manage stress, such as:  Meditation, yoga, or listening to music.  Journaling.  Talking to a trusted person.  Spending time with friends and family.  Minimize exposure to UV radiation to reduce your risk of skin cancer.  Safety  Always wear your seat belt while driving or riding in a vehicle.  Do not drive:  If you have been drinking alcohol. Do not ride with someone who has been drinking.  When you are tired or distracted.  While texting.  If you have been using any mind-altering substances or drugs.  Wear a helmet and other protective equipment during sports activities.  If you have firearms in your house, make sure you follow all gun safety procedures.  Seek help if you have been physically or sexually abused.  What's next?  Visit your health care provider once a year for an annual wellness visit.  Ask your health care provider how often you should have your eyes and teeth checked.  Stay up to date on all vaccines.  This information is not intended to replace advice given to you by your health care provider. Make sure you discuss any questions you have with your health care provider.  Document Revised: 05/15/2021 Document Reviewed: 05/15/2021  [employer]vier Patient Education  2024 [employer].

## 2024-08-24 ENCOUNTER — Ambulatory Visit: Payer: Self-pay | Admitting: Medical-Surgical

## 2024-08-24 ENCOUNTER — Encounter: Payer: Self-pay | Admitting: Medical-Surgical

## 2024-08-24 DIAGNOSIS — R7981 Abnormal blood-gas level: Secondary | ICD-10-CM

## 2024-08-24 LAB — CMP14+EGFR
ALT: 33 IU/L — ABNORMAL HIGH (ref 0–32)
AST: 23 IU/L (ref 0–40)
Albumin: 4.4 g/dL (ref 3.9–4.9)
Alkaline Phosphatase: 73 IU/L (ref 41–116)
BUN/Creatinine Ratio: 9 (ref 9–23)
BUN: 7 mg/dL (ref 6–24)
Bilirubin Total: <0.2 mg/dL (ref 0.0–1.2)
CO2: 16 mmol/L — ABNORMAL LOW (ref 20–29)
Calcium: 9.2 mg/dL (ref 8.7–10.2)
Chloride: 105 mmol/L (ref 96–106)
Creatinine, Ser: 0.8 mg/dL (ref 0.57–1.00)
Globulin, Total: 3 g/dL (ref 1.5–4.5)
Glucose: 112 mg/dL — ABNORMAL HIGH (ref 70–99)
Potassium: 4.5 mmol/L (ref 3.5–5.2)
Sodium: 138 mmol/L (ref 134–144)
Total Protein: 7.4 g/dL (ref 6.0–8.5)
eGFR: 94 mL/min/1.73 (ref >59)

## 2024-08-24 LAB — CBC WITH DIFFERENTIAL/PLATELET
Basophils Absolute: 0 x10E3/uL (ref 0.0–0.2)
Basos: 1 %
EOS (ABSOLUTE): 0.2 x10E3/uL (ref 0.0–0.4)
Eos: 3 %
Hematocrit: 40.7 % (ref 34.0–46.6)
Hemoglobin: 13.1 g/dL (ref 11.1–15.9)
Immature Grans (Abs): 0 x10E3/uL (ref 0.0–0.1)
Immature Granulocytes: 0 %
Lymphocytes Absolute: 1.7 x10E3/uL (ref 0.7–3.1)
Lymphs: 28 %
MCH: 29 pg (ref 26.6–33.0)
MCHC: 32.2 g/dL (ref 31.5–35.7)
MCV: 90 fL (ref 79–97)
Monocytes Absolute: 0.4 x10E3/uL (ref 0.1–0.9)
Monocytes: 6 %
Neutrophils Absolute: 3.8 x10E3/uL (ref 1.4–7.0)
Neutrophils: 62 %
Platelets: 353 x10E3/uL (ref 150–450)
RBC: 4.51 x10E6/uL (ref 3.77–5.28)
RDW: 12.6 % (ref 11.7–15.4)
WBC: 6.1 x10E3/uL (ref 3.4–10.8)

## 2024-08-24 LAB — LIPID PANEL
Chol/HDL Ratio: 4.6 ratio — ABNORMAL HIGH (ref 0.0–4.4)
Cholesterol, Total: 226 mg/dL — ABNORMAL HIGH (ref 100–199)
HDL: 49 mg/dL (ref >39)
LDL Chol Calc (NIH): 165 mg/dL — ABNORMAL HIGH (ref 0–99)
Triglycerides: 68 mg/dL (ref 0–149)
VLDL Cholesterol Cal: 12 mg/dL (ref 5–40)

## 2024-08-25 LAB — BASIC METABOLIC PANEL WITH GFR
BUN/Creatinine Ratio: 11 (ref 9–23)
BUN: 10 mg/dL (ref 6–24)
CO2: 20 mmol/L (ref 20–29)
Calcium: 9.5 mg/dL (ref 8.7–10.2)
Chloride: 101 mmol/L (ref 96–106)
Creatinine, Ser: 0.93 mg/dL (ref 0.57–1.00)
Glucose: 101 mg/dL — ABNORMAL HIGH (ref 70–99)
Potassium: 4.5 mmol/L (ref 3.5–5.2)
Sodium: 137 mmol/L (ref 134–144)
eGFR: 79 mL/min/1.73 (ref >59)

## 2024-09-16 ENCOUNTER — Ambulatory Visit (INDEPENDENT_AMBULATORY_CARE_PROVIDER_SITE_OTHER)

## 2024-09-16 DIAGNOSIS — Z1231 Encounter for screening mammogram for malignant neoplasm of breast: Secondary | ICD-10-CM

## 2025-02-21 ENCOUNTER — Ambulatory Visit: Admitting: Medical-Surgical
